# Patient Record
Sex: Male | Born: 1965 | Race: White | Hispanic: No | Marital: Married | Smoking: Former smoker
Health system: Southern US, Community
[De-identification: ages and names within clinical notes are randomized; demographics above are authoritative.]

## PROBLEM LIST (undated history)

## (undated) ENCOUNTER — Emergency Department: Discharge status: Absent without leave | Payer: Managed Care, Other (non HMO)

## (undated) DIAGNOSIS — I209 Angina pectoris, unspecified: Secondary | ICD-10-CM

## (undated) DIAGNOSIS — J189 Pneumonia, unspecified organism: Secondary | ICD-10-CM

## (undated) DIAGNOSIS — C4491 Basal cell carcinoma of skin, unspecified: Secondary | ICD-10-CM

## (undated) DIAGNOSIS — I10 Essential (primary) hypertension: Secondary | ICD-10-CM

## (undated) DIAGNOSIS — F419 Anxiety disorder, unspecified: Secondary | ICD-10-CM

## (undated) DIAGNOSIS — I639 Cerebral infarction, unspecified: Secondary | ICD-10-CM

## (undated) DIAGNOSIS — J45909 Unspecified asthma, uncomplicated: Secondary | ICD-10-CM

## (undated) DIAGNOSIS — M1990 Unspecified osteoarthritis, unspecified site: Secondary | ICD-10-CM

## (undated) DIAGNOSIS — G4730 Sleep apnea, unspecified: Secondary | ICD-10-CM

## (undated) DIAGNOSIS — Z8489 Family history of other specified conditions: Secondary | ICD-10-CM

## (undated) HISTORY — DX: Basal cell carcinoma of skin, unspecified: C44.91

## (undated) HISTORY — PX: HERNIA REPAIR: SHX51

## (undated) HISTORY — PX: FRACTURE SURGERY: SHX138

---

## 2004-06-02 ENCOUNTER — Emergency Department: Payer: Self-pay | Admitting: Unknown Physician Specialty

## 2004-06-16 ENCOUNTER — Emergency Department: Payer: Self-pay | Admitting: Emergency Medicine

## 2005-05-02 ENCOUNTER — Emergency Department: Payer: Self-pay | Admitting: Emergency Medicine

## 2005-05-02 ENCOUNTER — Other Ambulatory Visit: Payer: Self-pay

## 2005-11-20 ENCOUNTER — Emergency Department: Payer: Self-pay | Admitting: Emergency Medicine

## 2005-11-28 ENCOUNTER — Emergency Department: Payer: Self-pay | Admitting: Unknown Physician Specialty

## 2005-12-13 ENCOUNTER — Emergency Department (HOSPITAL_COMMUNITY): Admission: EM | Admit: 2005-12-13 | Discharge: 2005-12-13 | Payer: Self-pay | Admitting: Emergency Medicine

## 2006-03-21 ENCOUNTER — Ambulatory Visit: Payer: Self-pay | Admitting: Rheumatology

## 2007-11-17 ENCOUNTER — Emergency Department: Payer: Self-pay | Admitting: Emergency Medicine

## 2010-11-14 ENCOUNTER — Ambulatory Visit: Payer: Self-pay | Admitting: Specialist

## 2011-02-09 ENCOUNTER — Ambulatory Visit: Payer: Self-pay | Admitting: Surgery

## 2011-02-15 ENCOUNTER — Ambulatory Visit: Payer: Self-pay | Admitting: Surgery

## 2013-04-18 ENCOUNTER — Inpatient Hospital Stay: Payer: Self-pay | Admitting: Internal Medicine

## 2013-04-18 LAB — CBC
HCT: 47.1 % (ref 40.0–52.0)
HGB: 16.2 g/dL (ref 13.0–18.0)
MCH: 30.3 pg (ref 26.0–34.0)
MCHC: 34.3 g/dL (ref 32.0–36.0)
MCV: 88 fL (ref 80–100)
Platelet: 170 10*3/uL (ref 150–440)
RBC: 5.35 10*6/uL (ref 4.40–5.90)
RDW: 12.9 % (ref 11.5–14.5)
WBC: 5.1 10*3/uL (ref 3.8–10.6)

## 2013-04-18 LAB — COMPREHENSIVE METABOLIC PANEL
ALK PHOS: 90 U/L
ALT: 41 U/L (ref 12–78)
ANION GAP: 4 — AB (ref 7–16)
AST: 39 U/L — AB (ref 15–37)
Albumin: 3.7 g/dL (ref 3.4–5.0)
BILIRUBIN TOTAL: 0.4 mg/dL (ref 0.2–1.0)
BUN: 14 mg/dL (ref 7–18)
Calcium, Total: 8.3 mg/dL — ABNORMAL LOW (ref 8.5–10.1)
Chloride: 103 mmol/L (ref 98–107)
Co2: 29 mmol/L (ref 21–32)
Creatinine: 1.08 mg/dL (ref 0.60–1.30)
Glucose: 87 mg/dL (ref 65–99)
Osmolality: 272 (ref 275–301)
POTASSIUM: 4.1 mmol/L (ref 3.5–5.1)
Sodium: 136 mmol/L (ref 136–145)
Total Protein: 7.6 g/dL (ref 6.4–8.2)

## 2013-04-18 LAB — TROPONIN I: Troponin-I: <0.02 ng/mL

## 2013-04-18 LAB — CK TOTAL AND CKMB (NOT AT ARMC)
CK, Total: 225 U/L
CK-MB: <0.5 ng/mL — ABNORMAL LOW (ref 0.5–3.6)

## 2013-04-18 LAB — RAPID INFLUENZA A&B ANTIGENS

## 2013-04-19 LAB — CBC WITH DIFFERENTIAL/PLATELET
BASOS ABS: 0 10*3/uL (ref 0.0–0.1)
Basophil %: 0.5 %
EOS ABS: 0 10*3/uL (ref 0.0–0.7)
Eosinophil %: 0.1 %
HCT: 50.3 % (ref 40.0–52.0)
HGB: 16.6 g/dL (ref 13.0–18.0)
Lymphocyte #: 0.8 10*3/uL — ABNORMAL LOW (ref 1.0–3.6)
Lymphocyte %: 17.5 %
MCH: 29.1 pg (ref 26.0–34.0)
MCHC: 33 g/dL (ref 32.0–36.0)
MCV: 88 fL (ref 80–100)
MONO ABS: 0.2 x10 3/mm (ref 0.2–1.0)
Monocyte %: 3.8 %
Neutrophil #: 3.7 10*3/uL (ref 1.4–6.5)
Neutrophil %: 78.1 %
PLATELETS: 174 10*3/uL (ref 150–440)
RBC: 5.7 10*6/uL (ref 4.40–5.90)
RDW: 13.5 % (ref 11.5–14.5)
WBC: 4.7 10*3/uL (ref 3.8–10.6)

## 2013-04-19 LAB — BASIC METABOLIC PANEL
ANION GAP: 6 — AB (ref 7–16)
BUN: 15 mg/dL (ref 7–18)
CALCIUM: 8.3 mg/dL — AB (ref 8.5–10.1)
CREATININE: 0.92 mg/dL (ref 0.60–1.30)
Chloride: 103 mmol/L (ref 98–107)
Co2: 26 mmol/L (ref 21–32)
EGFR (Non-African Amer.): >60
Glucose: 139 mg/dL — ABNORMAL HIGH (ref 65–99)
Osmolality: 273 (ref 275–301)
POTASSIUM: 4.2 mmol/L (ref 3.5–5.1)
SODIUM: 135 mmol/L — AB (ref 136–145)

## 2013-04-20 LAB — CBC WITH DIFFERENTIAL/PLATELET
BASOS ABS: 0.1 10*3/uL (ref 0.0–0.1)
BASOS PCT: 0.4 %
EOS PCT: 0 %
Eosinophil #: 0 10*3/uL (ref 0.0–0.7)
HCT: 47.1 % (ref 40.0–52.0)
HGB: 16.1 g/dL (ref 13.0–18.0)
LYMPHS ABS: 1.3 10*3/uL (ref 1.0–3.6)
LYMPHS PCT: 8.2 %
MCH: 30.2 pg (ref 26.0–34.0)
MCHC: 34.1 g/dL (ref 32.0–36.0)
MCV: 88 fL (ref 80–100)
MONOS PCT: 4.7 %
Monocyte #: 0.7 x10 3/mm (ref 0.2–1.0)
Neutrophil #: 13.6 10*3/uL — ABNORMAL HIGH (ref 1.4–6.5)
Neutrophil %: 86.7 %
Platelet: 199 10*3/uL (ref 150–440)
RBC: 5.32 10*6/uL (ref 4.40–5.90)
RDW: 13.2 % (ref 11.5–14.5)
WBC: 15.7 10*3/uL — AB (ref 3.8–10.6)

## 2013-04-20 LAB — BASIC METABOLIC PANEL
ANION GAP: 8 (ref 7–16)
BUN: 22 mg/dL — AB (ref 7–18)
CHLORIDE: 99 mmol/L (ref 98–107)
CREATININE: 1 mg/dL (ref 0.60–1.30)
Calcium, Total: 8.3 mg/dL — ABNORMAL LOW (ref 8.5–10.1)
Co2: 28 mmol/L (ref 21–32)
EGFR (African American): >60
EGFR (Non-African Amer.): >60
GLUCOSE: 159 mg/dL — AB (ref 65–99)
Osmolality: 277 (ref 275–301)
Potassium: 3.9 mmol/L (ref 3.5–5.1)
Sodium: 135 mmol/L — ABNORMAL LOW (ref 136–145)

## 2013-04-23 LAB — CULTURE, BLOOD (SINGLE)

## 2014-01-10 ENCOUNTER — Emergency Department: Payer: Self-pay | Admitting: Emergency Medicine

## 2014-01-11 LAB — COMPREHENSIVE METABOLIC PANEL
AST: 30 U/L (ref 15–37)
Albumin: 4 g/dL (ref 3.4–5.0)
Alkaline Phosphatase: 113 U/L
Anion Gap: 9 (ref 7–16)
BUN: 17 mg/dL (ref 7–18)
Bilirubin,Total: 0.5 mg/dL (ref 0.2–1.0)
CALCIUM: 8.1 mg/dL — AB (ref 8.5–10.1)
CHLORIDE: 104 mmol/L (ref 98–107)
Co2: 26 mmol/L (ref 21–32)
Creatinine: 1 mg/dL (ref 0.60–1.30)
EGFR (African American): >60
GLUCOSE: 91 mg/dL (ref 65–99)
OSMOLALITY: 279 (ref 275–301)
Potassium: 3.7 mmol/L (ref 3.5–5.1)
SGPT (ALT): 42 U/L
Sodium: 139 mmol/L (ref 136–145)
Total Protein: 7.9 g/dL (ref 6.4–8.2)

## 2014-01-11 LAB — LIPASE, BLOOD: Lipase: 153 U/L (ref 73–393)

## 2014-01-11 LAB — CBC WITH DIFFERENTIAL/PLATELET
Basophil #: 0.1 10*3/uL (ref 0.0–0.1)
Basophil %: 0.6 %
EOS ABS: 0.2 10*3/uL (ref 0.0–0.7)
Eosinophil %: 1.3 %
HCT: 46.7 % (ref 40.0–52.0)
HGB: 15.9 g/dL (ref 13.0–18.0)
LYMPHS PCT: 31 %
Lymphocyte #: 3.5 10*3/uL (ref 1.0–3.6)
MCH: 29.6 pg (ref 26.0–34.0)
MCHC: 34.1 g/dL (ref 32.0–36.0)
MCV: 87 fL (ref 80–100)
MONO ABS: 0.9 x10 3/mm (ref 0.2–1.0)
Monocyte %: 7.8 %
NEUTROS ABS: 6.7 10*3/uL — AB (ref 1.4–6.5)
Neutrophil %: 59.3 %
Platelet: 294 10*3/uL (ref 150–440)
RBC: 5.38 10*6/uL (ref 4.40–5.90)
RDW: 13.5 % (ref 11.5–14.5)
WBC: 11.4 10*3/uL — AB (ref 3.8–10.6)

## 2014-01-11 LAB — TROPONIN I: Troponin-I: <0.02 ng/mL

## 2014-01-22 ENCOUNTER — Emergency Department (HOSPITAL_COMMUNITY): Payer: Managed Care, Other (non HMO)

## 2014-01-22 ENCOUNTER — Encounter (HOSPITAL_COMMUNITY): Payer: Self-pay

## 2014-01-22 ENCOUNTER — Inpatient Hospital Stay (HOSPITAL_COMMUNITY)
Admission: EM | Admit: 2014-01-22 | Discharge: 2014-01-25 | DRG: 069 | Disposition: A | Discharge status: Home / Self Care | Payer: Managed Care, Other (non HMO) | Attending: Neurology | Admitting: Neurology

## 2014-01-22 ENCOUNTER — Inpatient Hospital Stay (HOSPITAL_COMMUNITY): Payer: Managed Care, Other (non HMO)

## 2014-01-22 DIAGNOSIS — F329 Major depressive disorder, single episode, unspecified: Secondary | ICD-10-CM

## 2014-01-22 DIAGNOSIS — R51 Headache: Secondary | ICD-10-CM | POA: Diagnosis not present

## 2014-01-22 DIAGNOSIS — Z6830 Body mass index (BMI) 30.0-30.9, adult: Secondary | ICD-10-CM | POA: Diagnosis not present

## 2014-01-22 DIAGNOSIS — E785 Hyperlipidemia, unspecified: Secondary | ICD-10-CM | POA: Diagnosis present

## 2014-01-22 DIAGNOSIS — M069 Rheumatoid arthritis, unspecified: Secondary | ICD-10-CM | POA: Diagnosis present

## 2014-01-22 DIAGNOSIS — I10 Essential (primary) hypertension: Secondary | ICD-10-CM | POA: Diagnosis present

## 2014-01-22 DIAGNOSIS — I639 Cerebral infarction, unspecified: Secondary | ICD-10-CM

## 2014-01-22 DIAGNOSIS — E669 Obesity, unspecified: Secondary | ICD-10-CM | POA: Diagnosis present

## 2014-01-22 DIAGNOSIS — G459 Transient cerebral ischemic attack, unspecified: Secondary | ICD-10-CM | POA: Diagnosis not present

## 2014-01-22 DIAGNOSIS — M1990 Unspecified osteoarthritis, unspecified site: Secondary | ICD-10-CM | POA: Diagnosis present

## 2014-01-22 DIAGNOSIS — R112 Nausea with vomiting, unspecified: Secondary | ICD-10-CM | POA: Diagnosis not present

## 2014-01-22 DIAGNOSIS — R531 Weakness: Secondary | ICD-10-CM | POA: Diagnosis present

## 2014-01-22 DIAGNOSIS — F32A Depression, unspecified: Secondary | ICD-10-CM

## 2014-01-22 DIAGNOSIS — Z9109 Other allergy status, other than to drugs and biological substances: Secondary | ICD-10-CM

## 2014-01-22 HISTORY — DX: Unspecified osteoarthritis, unspecified site: M19.90

## 2014-01-22 HISTORY — DX: Essential (primary) hypertension: I10

## 2014-01-22 LAB — COMPREHENSIVE METABOLIC PANEL
ALBUMIN: 4.2 g/dL (ref 3.5–5.2)
ALT: 29 U/L (ref 0–53)
ANION GAP: 15 (ref 5–15)
AST: 22 U/L (ref 0–37)
Alkaline Phosphatase: 118 U/L — ABNORMAL HIGH (ref 39–117)
BILIRUBIN TOTAL: 0.5 mg/dL (ref 0.3–1.2)
BUN: 12 mg/dL (ref 6–23)
CALCIUM: 9.1 mg/dL (ref 8.4–10.5)
CHLORIDE: 96 meq/L (ref 96–112)
CO2: 25 mEq/L (ref 19–32)
CREATININE: 0.89 mg/dL (ref 0.50–1.35)
GFR calc Af Amer: >90 mL/min (ref >90)
GFR calc non Af Amer: >90 mL/min (ref >90)
Glucose, Bld: 122 mg/dL — ABNORMAL HIGH (ref 70–99)
Potassium: 4.4 mEq/L (ref 3.7–5.3)
Sodium: 136 mEq/L — ABNORMAL LOW (ref 137–147)
TOTAL PROTEIN: 7.5 g/dL (ref 6.0–8.3)

## 2014-01-22 LAB — RAPID URINE DRUG SCREEN, HOSP PERFORMED
AMPHETAMINES: NOT DETECTED
BENZODIAZEPINES: NOT DETECTED
Barbiturates: NOT DETECTED
COCAINE: NOT DETECTED
OPIATES: NOT DETECTED
TETRAHYDROCANNABINOL: NOT DETECTED

## 2014-01-22 LAB — I-STAT CHEM 8, ED
BUN: 12 mg/dL (ref 6–23)
CHLORIDE: 99 meq/L (ref 96–112)
Calcium, Ion: 1.09 mmol/L — ABNORMAL LOW (ref 1.12–1.23)
Creatinine, Ser: 0.9 mg/dL (ref 0.50–1.35)
Glucose, Bld: 127 mg/dL — ABNORMAL HIGH (ref 70–99)
HEMATOCRIT: 51 % (ref 39.0–52.0)
Hemoglobin: 17.3 g/dL — ABNORMAL HIGH (ref 13.0–17.0)
Potassium: 4.2 mEq/L (ref 3.7–5.3)
SODIUM: 136 meq/L — AB (ref 137–147)
TCO2: 24 mmol/L (ref 0–100)

## 2014-01-22 LAB — DIFFERENTIAL
BASOS ABS: 0.1 10*3/uL (ref 0.0–0.1)
BASOS PCT: 1 % (ref 0–1)
EOS ABS: 0.1 10*3/uL (ref 0.0–0.7)
EOS PCT: 1 % (ref 0–5)
Lymphocytes Relative: 23 % (ref 12–46)
Lymphs Abs: 2.3 10*3/uL (ref 0.7–4.0)
MONO ABS: 0.8 10*3/uL (ref 0.1–1.0)
MONOS PCT: 9 % (ref 3–12)
NEUTROS ABS: 6.6 10*3/uL (ref 1.7–7.7)
Neutrophils Relative %: 66 % (ref 43–77)

## 2014-01-22 LAB — PROTIME-INR
INR: 0.92 (ref 0.00–1.49)
Prothrombin Time: 12.5 seconds (ref 11.6–15.2)

## 2014-01-22 LAB — I-STAT TROPONIN, ED: TROPONIN I, POC: 0 ng/mL (ref 0.00–0.08)

## 2014-01-22 LAB — URINALYSIS, ROUTINE W REFLEX MICROSCOPIC
BILIRUBIN URINE: NEGATIVE
GLUCOSE, UA: NEGATIVE mg/dL
HGB URINE DIPSTICK: NEGATIVE
Ketones, ur: NEGATIVE mg/dL
Leukocytes, UA: NEGATIVE
Nitrite: NEGATIVE
PROTEIN: NEGATIVE mg/dL
Specific Gravity, Urine: 1.009 (ref 1.005–1.030)
UROBILINOGEN UA: 0.2 mg/dL (ref 0.0–1.0)
pH: 6.5 (ref 5.0–8.0)

## 2014-01-22 LAB — CBC
HEMATOCRIT: 46.9 % (ref 39.0–52.0)
HEMOGLOBIN: 16.7 g/dL (ref 13.0–17.0)
MCH: 30.5 pg (ref 26.0–34.0)
MCHC: 35.6 g/dL (ref 30.0–36.0)
MCV: 85.7 fL (ref 78.0–100.0)
Platelets: 324 10*3/uL (ref 150–400)
RBC: 5.47 MIL/uL (ref 4.22–5.81)
RDW: 12.5 % (ref 11.5–15.5)
WBC: 9.8 10*3/uL (ref 4.0–10.5)

## 2014-01-22 LAB — APTT: APTT: 30 s (ref 24–37)

## 2014-01-22 LAB — ETHANOL

## 2014-01-22 LAB — CBG MONITORING, ED: GLUCOSE-CAPILLARY: 121 mg/dL — AB (ref 70–99)

## 2014-01-22 MED ORDER — PANTOPRAZOLE SODIUM 40 MG IV SOLR
40.0000 mg | Freq: Every day | INTRAVENOUS | Status: DC
  Administered 2014-01-22: 40 mg via INTRAVENOUS
  Filled 2014-01-22 (×2): qty 40

## 2014-01-22 MED ORDER — ACETAMINOPHEN 325 MG PO TABS
650.0000 mg | ORAL_TABLET | ORAL | Status: DC | PRN
  Administered 2014-01-22 – 2014-01-24 (×4): 650 mg via ORAL
  Filled 2014-01-22 (×4): qty 2

## 2014-01-22 MED ORDER — STUDY - INVESTIGATIONAL DRUG SIMPLE RECORD
0.9000 mg/kg | Freq: Once | Status: AC
  Administered 2014-01-22: 83.7 mg via INTRAVENOUS
  Filled 2014-01-22: qty 83.7

## 2014-01-22 MED ORDER — STUDY - INVESTIGATIONAL DRUG SIMPLE RECORD
325.0000 mg | Freq: Once | Status: AC
  Administered 2014-01-22: 325 mg via ORAL
  Filled 2014-01-22: qty 325

## 2014-01-22 MED ORDER — TRAMADOL HCL 50 MG PO TABS
50.0000 mg | ORAL_TABLET | Freq: Four times a day (QID) | ORAL | Status: DC | PRN
  Administered 2014-01-22 – 2014-01-25 (×8): 50 mg via ORAL
  Filled 2014-01-22 (×4): qty 1

## 2014-01-22 MED ORDER — ACETAMINOPHEN 650 MG RE SUPP: 650.0000 mg | RECTAL | Status: DC | PRN

## 2014-01-22 MED ORDER — STROKE: EARLY STAGES OF RECOVERY BOOK
Freq: Once | Status: AC
  Administered 2014-01-22: 18:00:00
  Filled 2014-01-22: qty 1

## 2014-01-22 MED ORDER — SODIUM CHLORIDE 0.9 % IV SOLN
INTRAVENOUS | Status: DC
  Administered 2014-01-22 – 2014-01-23 (×2): via INTRAVENOUS

## 2014-01-22 MED ORDER — LABETALOL HCL 5 MG/ML IV SOLN
10.0000 mg | INTRAVENOUS | Status: DC | PRN
Start: 2014-01-22 — End: 2014-01-25
  Administered 2014-01-24: 10 mg via INTRAVENOUS
  Filled 2014-01-22 (×2): qty 4

## 2014-01-22 NOTE — ED Provider Notes (Signed)
48 y.o. Male presents as code stroke with report of left sided numbness and weakness that began one hour 15 minutes prior to arrival.  Patient assessed at bridge with stoke team Dr. Amada Jupiter.  Patient awake and alert with clear airway.  No obvious facial droop.  Dr.Kirkpatrick reports some residual left upper extremity weakness.  Patient states improving.   PMH hypertension meds unknown PE wdwn male nad No facial droop  Tone noted to all extremities  Patient to ct    Hilario Quarry, MD 01/24/14 [phone]

## 2014-01-22 NOTE — ED Notes (Signed)
Per EMS, patient was with a coworker and suddenly at 1330 patient had left sided weakness. EMS reports slight left sided droop, left arm weakness and left leg weakness. Reports symptoms are starting to improve.

## 2014-01-22 NOTE — Progress Notes (Addendum)
Patient arrived from ED accompanied with wfe. Safety precautions and orders reviewed with patient. He c/o minimal pain on his side of hid head and left extremeties. Patient and family verbalized that this is a new onset of pain. TELE applied and confirmed. MD paged. Dr. Hosie Poisson aware.   Sim Boast, RN

## 2014-01-22 NOTE — H&P (Signed)
Neurology H&P Reason for Consult: Left-sided weakness  CC: Left-sided weakness  History is obtained from: Patient  HPI: Fernando Goodwin is a 48 y.o. male who was in his normal state of health this morning, however around 1:30 PM he noticed a significant left-sided weakness. He states that it came on suddenly and EMS was called. Shortly before arrival in the hospital, he rapidly begin to improve, but did not return completely to baseline. At initial examination, he still had some mild sensory change on his left face as well as left leg and left hand weakness that were not felt to be disabling. After discussion with both him and his wife with Dr. Pearlean Brownie they decided to go ahead with PRISMS trial.  Of note, he has rheumatoid arthritis and has been on Enbrel in the past.   ROS: A 14 point ROS was performed and is negative except as noted in the HPI.   Past Medical History  Diagnosis Date  . Hypertension   . Arthritis     Family History: No history of stroke  Social History: Tob: Denies  Exam: Current vital signs: BP 145/73 mmHg  Pulse 87  Temp(Src) 97.5 F (36.4 C) (Oral)  Resp 20  Wt 92.987 kg (205 lb)  SpO2 94% Vital signs in last 24 hours: Temp:  [97.5 F (36.4 C)] 97.5 F (36.4 C) (12/03 1603) Pulse Rate:  [84-97] 87 (12/03 1645) Resp:  [16-23] 20 (12/03 1645) BP: (137-153)/(73-89) 145/73 mmHg (12/03 1645) SpO2:  [87 %-96 %] 94 % (12/03 1645) Weight:  [92.987 kg (205 lb)] 92.987 kg (205 lb) (12/03 1543)  General: In bed, NAD  Physical Exam  Constitutional: Appears well-developed and well-nourished.  Psych: Affect appropriate to situation Eyes: No scleral injection HENT: No OP obstrucion Head: Normocephalic.  Cardiovascular: Normal rate and regular rhythm.  Respiratory: Effort normal and breath sounds normal to anterior ascultation GI: Soft.  No distension. There is no tenderness.  Skin: WDI  Neuro: Mental Status: Patient is awake, alert, oriented to person,  place, month, year, and situation. Immediate and remote memory are intact. Patient is able to give a clear and coherent history. No signs of aphasia or neglect Cranial Nerves: II: Visual Fields are full. Pupils are equal, round, and reactive to light.  Discs are difficult to visualize. III,IV, VI: EOMI without ptosis or diploplia.  V: Facial sensation is symmetric to temperature VII: Facial movement is symmetric.  VIII: hearing is intact to voice X: Uvula elevates symmetrically XI: Shoulder shrug is symmetric. XII: tongue is midline without atrophy or fasciculations.  Motor: Tone is normal. Bulk is normal. 5/5 strength was present on the right. He has full strength proximally in the left arm with mild weakness and slowed movements of the left hand. He has 4/5 weakness of the left hip flexors.  Sensory: Sensation is symmetric to light touch and temperature in the arms and legs.  Deep Tendon Reflexes: 2+ and symmetric in the biceps and patellae.  Cerebellar: FNF and HKS are intact bilaterally Gait: Patient is able to ambulate without assistance.     I have reviewed labs in epic and the results pertinent to this consultation are: Cbc, cmp - unremarkable.   I have reviewed the images obtained:CT head - negative.   Impression: 48 yo M with left sided weakness most consistent with acute stroke. With improvement, he was not a candidate for on label tPA , but after discussion with him and his wife, they decided to pursue the PRISMS  trial(tpa vs. Placebo for mild stroke)  Recommendations: 1. HgbA1c, fasting lipid panel 2. MRI, MRA  of the brain without contrast 3. Frequent neuro checks 4. Echocardiogram 5. Carotid dopplers 6. Prophylactic therapy-Antiplatelet med: as per PRISMS trial.  7. Risk factor modification 8. Telemetry monitoring 9. PT consult, OT consult, Speech consult 10. contnue home paxil     This patient is critically ill and at significant risk of neurological  worsening, death and care requires constant monitoring of vital signs, hemodynamics,respiratory and cardiac monitoring, neurological assessment, discussion with family, other specialists and medical decision making of high complexity. I spent 60 minutes of neurocritical care time  in the care of  this patient.  Ritta Slot, MD [facility] [phone]  If 7pm- 7am, please page neurology on call as listed in AMION. 01/22/2014  5:32 PM

## 2014-01-22 NOTE — ED Provider Notes (Signed)
CSN: [id number]     Arrival date & time 01/22/14  1508 History   First MD Initiated Contact with Patient 01/22/14 1513     Chief Complaint  Patient presents with  . Code Stroke     (Consider location/radiation/quality/duration/timing/severity/associated sxs/prior Treatment) HPI Comments: Patient is a 48 year old male with history of hypertension. He presents with complaints of weakness to his left arm and leg and numbness to his left face that started at approximately 1:30 this afternoon while at work. He states he felt as if he "could not think". His symptoms began to resolve somewhat in route.  Patient is a 48 y.o. male presenting with weakness. The history is provided by the patient.  Weakness This is a new problem. The current episode started 1 to 2 hours ago. The problem occurs constantly. The problem has been gradually improving. Pertinent negatives include no chest pain, no headaches and no shortness of breath. Nothing aggravates the symptoms. Nothing relieves the symptoms. He has tried nothing for the symptoms. The treatment provided no relief.    Past Medical History  Diagnosis Date  . Hypertension   . Arthritis    Past Surgical History  Procedure Laterality Date  . Hernia repair     No family history on file. History  Substance Use Topics  . Smoking status: Not on file  . Smokeless tobacco: Not on file  . Alcohol Use: Not on file    Review of Systems  Respiratory: Negative for shortness of breath.   Cardiovascular: Negative for chest pain.  Neurological: Positive for weakness. Negative for headaches.  All other systems reviewed and are negative.     Allergies  Review of patient's allergies indicates no known allergies.  Home Medications   Prior to Admission medications   Not on File   BP 140/89 mmHg  Pulse 84  Temp(Src) 97.5 F (36.4 C) (Oral)  Resp 16  SpO2 96% Physical Exam  Constitutional: He is oriented to person, place, and time. He appears  well-developed and well-nourished. No distress.  HENT:  Head: Normocephalic and atraumatic.  Neck: Normal range of motion. Neck supple.  Cardiovascular: Normal rate, regular rhythm and normal heart sounds.   No murmur heard. Pulmonary/Chest: Effort normal and breath sounds normal. No respiratory distress. He has no wheezes.  Abdominal: Soft. Bowel sounds are normal. He exhibits no distension. There is no tenderness.  Musculoskeletal: Normal range of motion. He exhibits no edema.  Lymphadenopathy:    He has no cervical adenopathy.  Neurological: He is alert and oriented to person, place, and time. No cranial nerve deficit. He exhibits abnormal muscle tone. Coordination normal.  Strength is 4+ out of 5 in the left upper and left lower extremity. It is high out of 5 in the right upper and right lower extremity.  Skin: Skin is warm and dry. He is not diaphoretic.  Nursing note and vitals reviewed.   ED Course  Procedures (including critical care time) Labs Review Labs Reviewed  I-STAT CHEM 8, ED - Abnormal; Notable for the following:    Sodium 136 (*)    Glucose, Bld 127 (*)    Calcium, Ion 1.09 (*)    Hemoglobin 17.3 (*)    All other components within normal limits  PROTIME-INR  APTT  CBC  DIFFERENTIAL  ETHANOL  COMPREHENSIVE METABOLIC PANEL  URINE RAPID DRUG SCREEN (HOSP PERFORMED)  URINALYSIS, ROUTINE W REFLEX MICROSCOPIC  I-STAT TROPOININ, ED  I-STAT TROPOININ, ED    Imaging Review Ct Head Wo  Contrast  01/22/2014   CLINICAL DATA:  Left-sided weakness.  EXAM: CT HEAD WITHOUT CONTRAST  TECHNIQUE: Contiguous axial images were obtained from the base of the skull through the vertex without intravenous contrast.  COMPARISON:  CT scan of January 11, 2014.  FINDINGS: Bony calvarium appears intact. No mass effect or midline shift is noted. Ventricular size is within normal limits. There is no evidence of mass lesion, hemorrhage or acute infarction.  IMPRESSION: Normal head CT.  Critical Value/emergent results were called by telephone at the time of interpretation on 01/22/2014 at 3:39 pm to Dr. Margarita Grizzle , who verbally acknowledged these results.   Electronically Signed   By: Roque Lias M.D.   On: 01/22/2014 15:40     EKG Interpretation   Date/Time:  Thursday January 22 2014 15:31:50 EST Ventricular Rate:  85 PR Interval:  130 QRS Duration: 91 QT Interval:  377 QTC Calculation: 448 R Axis:   27 Text Interpretation:  Sinus rhythm Ventricular premature complex Consider  right atrial enlargement Baseline wander in lead(s) V3 Confirmed by DELOS   MD, Gwendloyn Forsee ([id number]) on 01/22/2014 3:49:36 PM      MDM   Final diagnoses:  None    Patient arrives to the ER as a code stroke with left arm and left leg numbness and weakness that started approximately 90 minutes prior to arrival. He was immediately taken to CAT scan and evaluated by myself and neurology. Dr. Amada Jupiter will admit the patient for further evaluation and intervention as deemed necessary.    Fernando Lyons, MD 01/22/14 [phone]

## 2014-01-22 NOTE — Code Documentation (Signed)
48yo male arriving to [facility] via [facility] at 1508.  Patient was at [employer] when he had sudden onset left sided weakness.  Code stroke called by EMS.  Patient taken to CT.  Stroke team at the bedside.  Initial NIHSS 2, see documentation for details and code stroke times.  Patient with improving symptoms.  Dr. Amada Jupiter discussed research enrollment with patient.  Dr. Pearlean Brownie and research team to the bedside to discuss PRISMS trial.  Patient agreed to PRISMS trial.  Patient passed Stroke Swallow Screen.  PRISMS study drug given at 1617.  Patient to be admitted to [facility].  Bedside handoff with ED RN Liz Beach.

## 2014-01-22 NOTE — Progress Notes (Signed)
Patient is having increased pain and tingling sensation in his head and radiating to left periorbital region. Patient requested for pain meds  Dr. Thad Ranger notified about the symtom. Will put in order for pain med and Rn will continue to monitor

## 2014-01-23 ENCOUNTER — Encounter (HOSPITAL_COMMUNITY): Payer: Self-pay | Admitting: *Deleted

## 2014-01-23 ENCOUNTER — Inpatient Hospital Stay (HOSPITAL_COMMUNITY): Payer: Managed Care, Other (non HMO)

## 2014-01-23 LAB — LIPID PANEL
CHOLESTEROL: 170 mg/dL (ref 0–200)
HDL: 39 mg/dL — ABNORMAL LOW (ref >39)
LDL Cholesterol: 78 mg/dL (ref 0–99)
TRIGLYCERIDES: 267 mg/dL — AB (ref <150)
Total CHOL/HDL Ratio: 4.4 RATIO
VLDL: 53 mg/dL — AB (ref 0–40)

## 2014-01-23 LAB — HEMOGLOBIN A1C
HEMOGLOBIN A1C: 5.5 % (ref <5.7)
MEAN PLASMA GLUCOSE: 111 mg/dL (ref <117)

## 2014-01-23 MED ORDER — ATORVASTATIN CALCIUM 40 MG PO TABS
ORAL_TABLET | ORAL | Status: AC
  Filled 2014-01-23: qty 1

## 2014-01-23 MED ORDER — PANTOPRAZOLE SODIUM 40 MG PO TBEC
40.0000 mg | DELAYED_RELEASE_TABLET | Freq: Every day | ORAL | Status: DC
  Administered 2014-01-23: 40 mg via ORAL

## 2014-01-23 MED ORDER — ATORVASTATIN CALCIUM 10 MG PO TABS
20.0000 mg | ORAL_TABLET | Freq: Every day | ORAL | Status: DC
  Administered 2014-01-23 – 2014-01-24 (×2): 20 mg via ORAL
  Filled 2014-01-23: qty 2

## 2014-01-23 MED ORDER — ALUM & MAG HYDROXIDE-SIMETH 200-200-20 MG/5ML PO SUSP: 15.0000 mL | ORAL | Status: DC | PRN

## 2014-01-23 MED ORDER — TRAMADOL HCL 50 MG PO TABS
ORAL_TABLET | ORAL | Status: AC
  Filled 2014-01-23: qty 1

## 2014-01-23 NOTE — Progress Notes (Signed)
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 He states that it came on suddenly and EMS was called. Shortly before arrival in the hospital, he rapidly begin to improve, but did not return completely to baseline. At initial examination, he still had some mild sensory change on his left face as well as left leg and left hand weakness that were not felt to be nondisabling.( NIHSS 1) After discussion with both him and his wife with Dr. Pearlean Brownie they decided to go ahead with PRISMS trial. Of note, he has rheumatoid arthritis and has been on Enbrel in the past. Patient was not considered for on label TPA secondary to symptomatic improvement. He was admitted for further evaluation and treatment.   SUBJECTIVE (INTERVAL HISTORY) Wife at bedside. Pt feeling better. Still on bedrest. Discharge today or tomorrow pending completion of evaluation.   OBJECTIVE Temp:  [97 F (36.1 C)-98.4 F (36.9 C)] 97.6 F (36.4 C) (12/04 0900) Pulse Rate:  [69-97] 76 (12/04 0900) Cardiac Rhythm:  [-] Normal sinus rhythm (12/04 0800) Resp:  [15-23] 18 (12/04 0900) BP: (111-154)/(62-94) 122/75 mmHg (12/04 0900) SpO2:  [87 %-99 %] 99 % (12/04 0900) Weight:  [91.173 kg (201 lb)-92.987 kg (205 lb)] 91.173 kg (201 lb) (12/03 1800)   Recent Labs Lab 01/22/14 1611  GLUCAP 121*    Recent Labs Lab 01/22/14 1512 01/22/14 1516  NA 136* 136*  K 4.4 4.2  CL 96 99  CO2 25  --   GLUCOSE 122* 127*  BUN 12 12  CREATININE 0.89 0.90  CALCIUM 9.1  --     Recent Labs Lab 01/22/14 1512  AST 22  ALT 29  ALKPHOS 118*  BILITOT 0.5  PROT 7.5  ALBUMIN 4.2    Recent Labs Lab 01/22/14 1512 01/22/14 1516  WBC 9.8  --   NEUTROABS 6.6  --   HGB 16.7 17.3*  HCT 46.9 51.0  MCV 85.7  --   PLT 324  --    No results for input(s): CKTOTAL, CKMB, CKMBINDEX, TROPONINI  in the last 168 hours.  Recent Labs  01/22/14 1512  LABPROT 12.5  INR 0.92    Recent Labs  01/22/14 1545  COLORURINE YELLOW  LABSPEC 1.009  PHURINE 6.5  GLUCOSEU NEGATIVE  HGBUR NEGATIVE  BILIRUBINUR NEGATIVE  KETONESUR NEGATIVE  PROTEINUR NEGATIVE  UROBILINOGEN 0.2  NITRITE NEGATIVE  LEUKOCYTESUR NEGATIVE       Component Value Date/Time   CHOL 170 01/23/2014 0540   TRIG 267* 01/23/2014 0540   HDL 39* 01/23/2014 0540   CHOLHDL 4.4 01/23/2014 0540   VLDL 53* 01/23/2014 0540   LDLCALC 78 01/23/2014 0540   No results found for: HGBA1C    Component Value Date/Time   LABOPIA NONE DETECTED 01/22/2014 1543   COCAINSCRNUR NONE DETECTED 01/22/2014 1543   LABBENZ NONE DETECTED 01/22/2014 1543   AMPHETMU NONE DETECTED 01/22/2014 1543   THCU NONE DETECTED 01/22/2014 1543   LABBARB NONE DETECTED 01/22/2014 1543     Recent Labs Lab 01/22/14 1512  ETH <11    Ct Head Wo Contrast 01/22/2014    Stable and normal noncontrast CT appearance of the brain.   Ct Head Wo Contrast 01/22/2014  Normal head CT.    MRI / MRA - pending      PHYSICAL EXAM Pleasant middle aged Caucasian male not in  distress,Awake alert. Afebrile. Head is nontraumatic. Neck is supple without bruit. Hearing is normal. Cardiac exam no murmur or gallop. Lungs are clear to auscultation. Distal pulses are well felt.  Neurological Exam ;  Awake  Alert oriented x 3. Normal speech and language.eye movements full without nystagmus.fundi were not visualized. Vision acuity and fields appear normal. Hearing is normal. Palatal movements are normal. Face symmetric. Tongue midline. Normal strength, tone, reflexes and coordination mild diminished fine finger movements on the left. Mild weakness of left grip. Orbits right over left upper extremity. Normal sensation. Gait deferred.   ASSESSMENT/PLAN Mr. ABSHIR PAOLINI is a 48 y.o. male with history of RA and hypertension presenting with left sided weakness. He  did not receive IV t-PA per standard protocol due to symptom improvement; he was then enrolled in the PRISMS trial (tPA vs no tPA in pts too mild to treat).   Stroke:  Non-dominant right brain subcortical infarct/ TIA s/p PRISMS enrollment, likely secondary to  small vessel disease    Resultant  Left hemiparesis  MRI  pending  MRA  pending  Carotid Doppler  pending  2D Echo  pending  HgbA1c pending  SCDs for VTE prophylaxis  Diet Heart thin liquids  aspirin 81 mg orally every day prior to admission, received aspirin vs placebo yesterday for antithrombotic (Prisms protocol, based on tPA vs no tPA, blinded) now on no antithrombotics. Add  Aspirin after MRi  Patient counseled to be compliant with his antithrombotic medications  Ongoing aggressive stroke risk factor management  Therapy recommendations:  pending  Disposition:  Probable discharge to home Saturday.  Hypertension  Home meds:   Lisinopril, hyzaar  Stable  Hyperlipidemia  Home meds:  No statin  LDL 78, goal < 70  Lipitor added  Other Stroke Risk Factors  Obesity, Body mass index is 30.57 kg/(m^2).   Other Active Problems  RA  Hospital day # 1  Delton See PA-C [facility] Pager [phone] 01/23/2014, 3:09 PM I have personally examined this patient, reviewed notes, independently viewed imaging studies, participated in medical decision making and plan of care. I have made any additions or clarifications directly to the above note. Agree with note above. Suspect small subcortical right brain infarct due to small vessel disease versus TIA. Complete neurovascular workup. Likely discharge home in the morning after work up completed.  Delia Heady, MD Medical Director [facility] Pager: [phone] 01/23/2014 4:04 PM     To contact Stroke Continuity provider, please refer to [email or web address]. After hours, contact General Neurology

## 2014-01-23 NOTE — Evaluation (Signed)
Physical Therapy Evaluation/Discharge Patient Details Name: Fernando Goodwin MRN: [id number] DOB: 01-18-1966 Today's Date: 01/23/2014   History of Present Illness  48 yo male admitted with sudden L side weakness, L facial droop, LLE weakness. NIH= 2 stroke workup under way.  CT scan negative and MRI pending. PMH: HTN, arthritis, and hernia repair .  Clinical Impression  Pt is mobilizing at mod I and I levels, just slowly and cautiously. He has some very minimal strength deficits in left leg (4/5 per in bed and EOB MMT).  He is able to ascend and descend stairs and walk/get out of bed without physical assistance.  He does not currently have any acute or follow up PT needs at this time.  I encouraged him to continue to mobilize as his HA allowed.  PT will sign off.     Follow Up Recommendations No PT follow up    Equipment Recommendations  None recommended by PT    Recommendations for Other Services   NA    Precautions / Restrictions Precautions Precautions: None      Mobility  Bed Mobility Overal bed mobility: Independent                Transfers Overall transfer level: Independent Equipment used: None                Ambulation/Gait Ambulation/Gait assistance: Modified independent (Device/Increase time) Ambulation Distance (Feet): 100 Feet Assistive device: None Gait Pattern/deviations: Step-through pattern Gait velocity: decreased Gait velocity interpretation: Below normal speed for age/gender General Gait Details: Pt with slow, cautious gait pattern, but no obvious gait deviations or signs of buckling or dragging of his left foot.   Stairs Stairs: Yes Stairs assistance: Modified independent (Device/Increase time) Stair Management: One rail Right;Step to pattern;Forwards Number of Stairs: 5 General stair comments: Pt taking his time, but again, no signs of buckling on stairs, just slow, cautious gait pattern.       Modified Rankin (Stroke Patients  Only) Modified Rankin (Stroke Patients Only) Pre-Morbid Rankin Score: No symptoms Modified Rankin: Slight disability     Balance Overall balance assessment: No apparent balance deficits (not formally assessed)                                           Pertinent Vitals/Pain Pain Assessment: 0-10 Pain Score: 5  Pain Location: headache Pain Descriptors / Indicators: Aching Pain Intervention(s): Limited activity within patient's tolerance;Monitored during session;Premedicated before session;Repositioned    Home Living Family/patient expects to be discharged to:: Private residence Living Arrangements: Alone Available Help at Discharge: Family;Friend(s);Available PRN/intermittently Type of Home: House Home Access: Stairs to enter Entrance Stairs-Rails: None Entrance Stairs-Number of Steps: 4 Home Layout: One level Home Equipment: None      Prior Function Level of Independence: Independent         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 Coordination and sensation testing WNL.   Cervical / Trunk Assessment: Normal  Communication   Communication: No difficulties  Cognition Arousal/Alertness: Awake/alert Behavior During Therapy: WFL for tasks  assessed/performed Overall Cognitive Status: Within Functional Limits for tasks assessed                               Assessment/Plan    PT Assessment Patent does not need any further PT services  PT Diagnosis Difficulty walking;Abnormality of gait;Generalized weakness;Hemiplegia non-dominant side;Acute pain   PT Problem List    PT Treatment Interventions     PT Goals (Current goals can be found in the  Care Plan section) Acute Rehab PT Goals PT Goal Formulation: All assessment and education complete, DC therapy               End of Session Equipment Utilized During Treatment: Gait belt Activity Tolerance: Patient tolerated treatment well Patient left: in bed;with family/visitor present (seated EOB with daughter) Nurse Communication: Mobility status         Time: 7510-2585 PT Time Calculation (min) (ACUTE ONLY): 26 min   Charges:   PT Evaluation $Initial PT Evaluation Tier I: 1 Procedure PT Treatments $Gait Training: 8-22 mins        Talbot Monarch B. Macaila Tahir, PT, DPT [phone]   01/23/2014, 4:20 PM

## 2014-01-23 NOTE — Progress Notes (Addendum)
Patient c/o feeling worsen this morning after eating breakfast. He feel as if he is going to be nauseous but unsure. VSS. Patient is now c/o tingling on his left eyes radiating down to his left arm. Wife at bedside. Stroke team paged. Mrs. Catha Gosselin aware. Will continue to monitor.   Sim Boast, RN

## 2014-01-23 NOTE — Plan of Care (Signed)
Problem: Acute Treatment Outcomes Goal: Hemodynamically stable Outcome: Completed/Met Date Met:  01/23/14

## 2014-01-23 NOTE — Plan of Care (Signed)
Problem: Acute Treatment Outcomes Goal: Neuro exam at baseline or improved Outcome: Completed/Met Date Met:  01/23/14     

## 2014-01-23 NOTE — Plan of Care (Signed)
Problem: Acute Treatment Outcomes Goal: Airway maintained/protected Outcome: Completed/Met Date Met:  01/23/14

## 2014-01-23 NOTE — Evaluation (Signed)
Speech Language Pathology Evaluation Patient Details Name: Fernando Goodwin MRN: [id number] DOB: 1965-08-25 Today's Date: 01/23/2014 Time: 6503-5465 SLP Time Calculation (min) (ACUTE ONLY): 33 min  Problem List:  Patient Active Problem List   Diagnosis Date Noted  . Stroke 01/22/2014   Past Medical History:  Past Medical History  Diagnosis Date  . Hypertension   . Arthritis    Past Surgical History:  Past Surgical History  Procedure Laterality Date  . Hernia repair     HPI:  48 yo male admitted with sudden L side weakness, L facial droop, LLE weakness. NIH= 2 stroke workup under way.  CT scan negative and MRI pending. PMH: HTN, arthritis, and hernia repair .    Assessment / Plan / Recommendation Clinical Impression  Pt performed [facility] for cognitive-linguistic tasks, and speech is intelligible at the conversational level. Pt reports feeling "slower" to process and respond, however outside of structured tasks participates in functional and spontaneous conversations fluidly. No acute SLP f/u recommended at this time.    SLP Assessment  Patient does not need any further Speech Lanaguage Pathology Services    Follow Up Recommendations  None    Frequency and Duration        Pertinent Vitals/Pain Pain Assessment: 0-10 Pain Score: 5  Pain Location: headache Pain Descriptors / Indicators: Aching Pain Intervention(s): Monitored during session;Patient requesting pain meds-RN notified   SLP Goals  Patient/Family Stated Goal: none stated  SLP Evaluation Prior Functioning  Cognitive/Linguistic Baseline: Within functional limits Type of Home: House  Lives With: Spouse Available Help at Discharge: Family;Friend(s);Available PRN/intermittently Vocation: Full time employment   Cognition  Overall Cognitive Status: Within Functional Limits for tasks assessed Orientation Level: Oriented X4    Comprehension  Auditory Comprehension Overall Auditory Comprehension: Appears within  functional limits for tasks assessed Visual Recognition/Discrimination Discrimination: Within Function Limits Reading Comprehension Reading Status: Within funtional limits    Expression Expression Primary Mode of Expression: Verbal Verbal Expression Overall Verbal Expression: Appears within functional limits for tasks assessed Written Expression Dominant Hand: Right Written Expression: Not tested   Oral / Motor Oral Motor/Sensory Function Overall Oral Motor/Sensory Function: Appears within functional limits for tasks assessed Motor Speech Overall Motor Speech: Appears within functional limits for tasks assessed   GO       Maxcine Ham, M.A. CCC-SLP [phone]  Maxcine Ham 01/23/2014, 4:51 PM

## 2014-01-23 NOTE — Plan of Care (Signed)
Problem: Acute Treatment Outcomes Goal: BP within ordered parameters Outcome: Completed/Met Date Met:  01/23/14

## 2014-01-23 NOTE — Plan of Care (Signed)
Problem: Acute Treatment Outcomes Goal: 02 Sats > 94% Outcome: Completed/Met Date Met:  01/23/14     

## 2014-01-23 NOTE — Progress Notes (Signed)
  Echocardiogram 2D Echocardiogram has been performed.  Fernando Goodwin 01/23/2014, 10:44 AM

## 2014-01-23 NOTE — Plan of Care (Signed)
Problem: Acute Treatment Outcomes Goal: tPA Patient w/o S&S of bleeding Outcome: Completed/Met Date Met:  01/23/14

## 2014-01-23 NOTE — Plan of Care (Signed)
Problem: tPA Day Progression Outcomes-Only if tPA administered Goal: Post tPA VS, neuro checks Outcome: Progressing

## 2014-01-24 DIAGNOSIS — E785 Hyperlipidemia, unspecified: Secondary | ICD-10-CM

## 2014-01-24 DIAGNOSIS — I10 Essential (primary) hypertension: Secondary | ICD-10-CM

## 2014-01-24 DIAGNOSIS — G451 Carotid artery syndrome (hemispheric): Secondary | ICD-10-CM

## 2014-01-24 LAB — BASIC METABOLIC PANEL
Anion gap: 14 (ref 5–15)
BUN: 12 mg/dL (ref 6–23)
CO2: 25 mEq/L (ref 19–32)
Calcium: 9 mg/dL (ref 8.4–10.5)
Chloride: 97 mEq/L (ref 96–112)
Creatinine, Ser: 0.92 mg/dL (ref 0.50–1.35)
GLUCOSE: 113 mg/dL — AB (ref 70–99)
Potassium: 4.2 mEq/L (ref 3.7–5.3)
Sodium: 136 mEq/L — ABNORMAL LOW (ref 137–147)

## 2014-01-24 LAB — CBC
HCT: 45.2 % (ref 39.0–52.0)
Hemoglobin: 15.7 g/dL (ref 13.0–17.0)
MCH: 29.9 pg (ref 26.0–34.0)
MCHC: 34.7 g/dL (ref 30.0–36.0)
MCV: 86.1 fL (ref 78.0–100.0)
PLATELETS: 280 10*3/uL (ref 150–400)
RBC: 5.25 MIL/uL (ref 4.22–5.81)
RDW: 12.7 % (ref 11.5–15.5)
WBC: 9 10*3/uL (ref 4.0–10.5)

## 2014-01-24 MED ORDER — ALBUTEROL SULFATE (2.5 MG/3ML) 0.083% IN NEBU: 2.5000 mg | INHALATION_SOLUTION | Freq: Four times a day (QID) | RESPIRATORY_TRACT | Status: DC | PRN

## 2014-01-24 MED ORDER — LORATADINE 10 MG PO TABS
10.0000 mg | ORAL_TABLET | Freq: Every day | ORAL | Status: DC
  Administered 2014-01-24 – 2014-01-25 (×2): 10 mg via ORAL
  Filled 2014-01-24 (×2): qty 1

## 2014-01-24 MED ORDER — PAROXETINE HCL 20 MG PO TABS
20.0000 mg | ORAL_TABLET | Freq: Every day | ORAL | Status: DC
  Administered 2014-01-24 – 2014-01-25 (×2): 20 mg via ORAL
  Filled 2014-01-24 (×2): qty 1

## 2014-01-24 MED ORDER — ALBUTEROL SULFATE HFA 108 (90 BASE) MCG/ACT IN AERS: 2.0000 | INHALATION_SPRAY | Freq: Four times a day (QID) | RESPIRATORY_TRACT | Status: DC | PRN

## 2014-01-24 MED ORDER — CIPROFLOXACIN-DEXAMETHASONE 0.3-0.1 % OT SUSP
4.0000 [drp] | Freq: Two times a day (BID) | OTIC | Status: DC
  Administered 2014-01-24 – 2014-01-25 (×3): 4 [drp] via OTIC
  Filled 2014-01-24 (×2): qty 7.5

## 2014-01-24 MED ORDER — TRAMADOL HCL 50 MG PO TABS
ORAL_TABLET | ORAL | Status: AC
  Filled 2014-01-24: qty 1

## 2014-01-24 MED ORDER — CLOPIDOGREL BISULFATE 75 MG PO TABS
75.0000 mg | ORAL_TABLET | Freq: Every day | ORAL | Status: DC
  Administered 2014-01-24 – 2014-01-25 (×2): 75 mg via ORAL
  Filled 2014-01-24 (×2): qty 1

## 2014-01-24 MED ORDER — NAPROXEN 250 MG PO TABS
500.0000 mg | ORAL_TABLET | ORAL | Status: AC
  Administered 2014-01-24: 500 mg via ORAL
  Filled 2014-01-24: qty 2

## 2014-01-24 MED ORDER — LOSARTAN POTASSIUM 50 MG PO TABS
50.0000 mg | ORAL_TABLET | Freq: Every day | ORAL | Status: DC
  Administered 2014-01-24 – 2014-01-25 (×2): 50 mg via ORAL
  Filled 2014-01-24 (×2): qty 1

## 2014-01-24 MED ORDER — TRIAMCINOLONE ACETONIDE 55 MCG/ACT NA AERO
2.0000 | INHALATION_SPRAY | Freq: Every day | NASAL | Status: DC
  Administered 2014-01-24 – 2014-01-25 (×2): 2 via NASAL
  Filled 2014-01-24 (×2): qty 21.6

## 2014-01-24 MED ORDER — ONDANSETRON HCL 4 MG/2ML IJ SOLN
4.0000 mg | Freq: Four times a day (QID) | INTRAMUSCULAR | Status: DC | PRN
  Administered 2014-01-24 – 2014-01-25 (×2): 4 mg via INTRAVENOUS
  Filled 2014-01-24 (×2): qty 2

## 2014-01-24 MED ORDER — FAMOTIDINE 20 MG PO TABS
20.0000 mg | ORAL_TABLET | Freq: Two times a day (BID) | ORAL | Status: DC
  Administered 2014-01-24 – 2014-01-25 (×3): 20 mg via ORAL
  Filled 2014-01-24 (×4): qty 1

## 2014-01-24 NOTE — Progress Notes (Signed)
VASCULAR LAB PRELIMINARY  PRELIMINARY  PRELIMINARY  PRELIMINARY  Carotid Dopplers completed.    Preliminary report:  1-39% ICA stenosis.  Vertebral artery flow is antegrade.   Nakeem Murnane, RVT 01/24/2014, 3:46 PM

## 2014-01-24 NOTE — Discharge Summary (Signed)
Stroke Discharge Summary  Patient ID: Fernando Goodwin   MRN: [id number]      DOB: 11-Apr-1965  Date of Admission: 01/22/2014 Date of Discharge: 01/25/2014  Attending Physician:  Marvel Plan, MD, Stroke MD  Consulting Physician(s):   Treatment Team:  [facility] Stroke, MD Dr Pearlean Brownie Patient's PCP:  Dr Marcello Fennel [facility])  DISCHARGE DIAGNOSIS: Right brain subcortical TIA Active Problems:     BMI: Body mass index is 30.57 kg/(m^2).  Past Medical History  Diagnosis Date  . Hypertension   . Arthritis    Past Surgical History  Procedure Laterality Date  . Hernia repair        Medication List    STOP taking these medications        aspirin 325 MG tablet     aspirin EC 81 MG tablet     lisinopril 10 MG tablet  Commonly known as:  PRINIVIL,ZESTRIL     pantoprazole 40 MG tablet  Commonly known as:  PROTONIX     pseudoephedrine 120 MG 12 hr tablet  Commonly known as:  SUDAFED      TAKE these medications        acetaminophen 325 MG tablet  Commonly known as:  TYLENOL  Take 2 tablets (650 mg total) by mouth every 6 (six) hours as needed for mild pain.     atorvastatin 20 MG tablet  Commonly known as:  LIPITOR  Take 1 tablet (20 mg total) by mouth daily at 6 PM.     Cetirizine HCl 10 MG Caps  Take 10 mg by mouth daily.     CIPRODEX otic suspension  Generic drug:  ciprofloxacin-dexamethasone  Place 4 drops into the right ear 2 (two) times daily.     clopidogrel 75 MG tablet  Commonly known as:  PLAVIX  Take 1 tablet (75 mg total) by mouth daily.     COQ10 PO  Take 1 capsule by mouth daily.     cyclobenzaprine 10 MG tablet  Commonly known as:  FLEXERIL  Take 10 mg by mouth at bedtime as needed (for sleep).     famotidine 20 MG tablet  Commonly known as:  PEPCID  Take 1 tablet (20 mg total) by mouth 2 (two) times daily.     losartan-hydrochlorothiazide 50-12.5 MG per tablet  Commonly known as:  HYZAAR  Take 1 tablet by mouth daily.     naproxen 500 MG  tablet  Commonly known as:  NAPROSYN  Take 500 mg by mouth daily.     NASACORT ALLERGY 24HR 55 MCG/ACT Aero nasal inhaler  Generic drug:  triamcinolone  Inhale 2 sprays into the lungs daily as needed (for congestion).     PARoxetine 20 MG tablet  Commonly known as:  PAXIL  Take 20 mg by mouth daily.     PROAIR HFA 108 (90 BASE) MCG/ACT inhaler  Generic drug:  albuterol  Inhale 2 puffs into the lungs every 6 (six) hours as needed.        LABORATORY STUDIES CBC    Component Value Date/Time   WBC 9.0 01/24/2014 1716   RBC 5.25 01/24/2014 1716   HGB 15.7 01/24/2014 1716   HCT 45.2 01/24/2014 1716   PLT 280 01/24/2014 1716   MCV 86.1 01/24/2014 1716   MCH 29.9 01/24/2014 1716   MCHC 34.7 01/24/2014 1716   RDW 12.7 01/24/2014 1716   LYMPHSABS 2.3 01/22/2014 1512   MONOABS 0.8 01/22/2014 1512   EOSABS 0.1 01/22/2014  1512   BASOSABS 0.1 01/22/2014 1512   CMP    Component Value Date/Time   NA 136* 01/24/2014 1716   K 4.2 01/24/2014 1716   CL 97 01/24/2014 1716   CO2 25 01/24/2014 1716   GLUCOSE 113* 01/24/2014 1716   BUN 12 01/24/2014 1716   CREATININE 0.92 01/24/2014 1716   CALCIUM 9.0 01/24/2014 1716   PROT 7.5 01/22/2014 1512   ALBUMIN 4.2 01/22/2014 1512   AST 22 01/22/2014 1512   ALT 29 01/22/2014 1512   ALKPHOS 118* 01/22/2014 1512   BILITOT 0.5 01/22/2014 1512   GFRNONAA >90 01/24/2014 1716   GFRAA >90 01/24/2014 1716   COAGS Lab Results  Component Value Date   INR 0.92 01/22/2014   Lipid Panel    Component Value Date/Time   CHOL 170 01/23/2014 0540   TRIG 267* 01/23/2014 0540   HDL 39* 01/23/2014 0540   CHOLHDL 4.4 01/23/2014 0540   VLDL 53* 01/23/2014 0540   LDLCALC 78 01/23/2014 0540   HgbA1C  Lab Results  Component Value Date   HGBA1C 5.5 01/23/2014   Cardiac Panel (last 3 results) No results for input(s): CKTOTAL, CKMB, TROPONINI, RELINDX in the last 72 hours. Urinalysis    Component Value Date/Time   COLORURINE YELLOW 01/22/2014  1545   APPEARANCEUR CLEAR 01/22/2014 1545   LABSPEC 1.009 01/22/2014 1545   PHURINE 6.5 01/22/2014 1545   GLUCOSEU NEGATIVE 01/22/2014 1545   HGBUR NEGATIVE 01/22/2014 1545   BILIRUBINUR NEGATIVE 01/22/2014 1545   KETONESUR NEGATIVE 01/22/2014 1545   PROTEINUR NEGATIVE 01/22/2014 1545   UROBILINOGEN 0.2 01/22/2014 1545   NITRITE NEGATIVE 01/22/2014 1545   LEUKOCYTESUR NEGATIVE 01/22/2014 1545   Urine Drug Screen     Component Value Date/Time   LABOPIA NONE DETECTED 01/22/2014 1543   COCAINSCRNUR NONE DETECTED 01/22/2014 1543   LABBENZ NONE DETECTED 01/22/2014 1543   AMPHETMU NONE DETECTED 01/22/2014 1543   THCU NONE DETECTED 01/22/2014 1543   LABBARB NONE DETECTED 01/22/2014 1543    Alcohol Level    Component Value Date/Time   ETH <11 01/22/2014 1512     SIGNIFICANT DIAGNOSTIC STUDIES  Ct Head Wo Contrast 01/22/2014  Stable and normal noncontrast CT appearance of the brain.   Ct Head Wo Contrast 01/22/2014  Normal head CT.   MRI / MRA  01/23/2014 Negative MRI and MRA. Good general agreement prior normal CT.  Carotid ultrasound - 1-39% ICA stenosis. Vertebral artery flow is antegrade.  2D echo - pending. The computer system for the echocardiograms is currently down secondary to a systemwide malfunction. Results are not available at this time although the patient's echo has been performed. He will be contacted if there are any abnormal findings.    HISTORY OF PRESENT ILLNESS  Fernando Goodwin is a 48 y.o. male who was in his normal state of health on the morning of 01/22/2014, however at around 1:30 PM he noticed a significant left-sided weakness. He states that it came on suddenly and EMS was called. Shortly before arrival in the hospital, he rapidly began to improve, but did not return completely to baseline. At initial examination, he still had some mild sensory change on his left face, left leg, and left hand weakness that were  felt to be nondisabling.( NIHSS  1) After discussion with both him and his wife and Dr. Pearlean Brownie they decided to go ahead with PRISMS trial. (TPA versus no TPA in patients too mild to treat) Of note, he has rheumatoid arthritis and has  been on Enbrel in the past. Patient was not considered for on label TPA secondary to symptomatic improvement. He was admitted for further evaluation and treatment.  HOSPITAL COURSE Following enrollment in the PRISMS trial the patient was admitted to the hospital. Shortly after admission he developed a headache which was treated symptomatically. He did have intermittent headaches throughout his stay. He was also bothered by nausea and at one point vomited. Again this was treated symptomatically.  It was believed that the patient had a small subcortical right brain infarct secondary to small vessel disease versus a TIA. An MRI later demonstrated no infarct as noted above.  The patient's LDL was 78; however, the current goal for a stroke patient is an LDL less than 70. Lipitor therapy was initiated. The patient's body mass index was consistent with obesity and it was recommended that he try to lose weight. The patient had been on aspirin 81 mg prior to admission. 24 hours after initiating PRISMS medication a CT of the head, as well as an MRI,  was performed that showed no bleeding. Since the patient had essentially failed aspirin therapy a decision was made to place him on Plavix 75 mg daily. His protonix was changed to Pepcid due to concerns that the Protonix would interfere with the effectiveness of the Plavix.  The patient was evaluated by the physical, occupational, and speech therapists. No follow-up therapies were felt to be indicated.  The patient's blood pressure medications were held at time of admission to allow for permissive hypertension in the setting of a possible acute stroke. The patient's blood pressure remained labile throughout his stay. Eventually losartan was resumed although the patient  was instructed to continue to hold his Lotensin until this could be evaluated on an outpatient basis by his primary care physician. The goal at this time would be for a normal blood pressure range.  On the day of discharge a workup for a possible pheochromocytoma was initiated. Plasma metanephrines, aldosterone and renin activity with ratio, and fractionated plasma catecholamines were ordered. The lab informed us that the catecholamines would need to be performed fasting. We did not want to delay the patient's discharge to complete the blood work so it was hoped that the patient's primary care physician could have the fractionated catecholamines performed fasting on an outpatient basis.  Part of the patient's workup included a 2-D echocardiogram. This study was performed during his stay, unfortunately though the reading system was down for several days and the study could not be interpreted. If any abnormalities are found the patient will be contacted.  Arrangements were eventually made to discharge the patient in stable and improved condition on December 6th with plans for early follow-up with his primary care physician.  DISCHARGE EXAM Blood pressure 108/66, pulse 77, temperature 98.1 F (36.7 C), temperature source Oral, resp. rate 18, height 5\' 8"  (1.727 m), weight 201 lb (91.173 kg), SpO2 96 %. Pleasant middle aged Caucasian male not in distress,Awake alert. Afebrile. Head is nontraumatic. Neck is supple without bruit. Hearing is normal. Cardiac exam no murmur or gallop. Lungs are clear to auscultation. Distal pulses are well felt. Neurological Exam ;  Awake Alert oriented x 3. Normal speech and language.eye movements full without nystagmus.fundi were not visualized. Vision acuity and fields appear normal. Hearing is normal. Palatal movements are normal. Face symmetric. Tongue midline. Normal strength, tone, reflexes and coordination. Normal sensation. Gait deferred.  Discharge Diet   Diet  Heart Diet - low sodium heart healthy with  thin liquids  Patient discharge instructions 1. Obtain a blood pressure home monitoring device. Check blood pressure every morning and every evening and write down the results. Bring this information to your next primary care visit for further adjustment of blood pressure medications. 2. Do not take decongestants such as Sudafed ( pseudoephedrine ) as this can cause an abnormal increase in your blood pressure. 3. Try to see your primary care physician within the next week for further evaluation of blood pressure and medications. 4. Do not take Protonix as this medication will interfere with Plavix. Take Pepcid instead. 5. Do not return to work for at least 2 weeks. 6. Gradually increase your activity and avoid any strenuous activities for at least 1-2 weeks. 7. Limit your driving initially until feeling back to normal. 8. Your echocardiogram results and blood work to evaluate for possible pheochromocytoma are currently pending. 9. Plasma fractionated catecholamines (blood test for pheochromocytoma) must be performed fasting (on an empty stomach). Perhaps your primary care physician could make arrangements for this blood test. 10. Do not take lisinopril for now unless instructed by her physician. 11. Try to lose some weight.  DISCHARGE PLAN  Disposition:  Discharged to home  clopidogrel 75 mg orally every day for secondary stroke prevention.  Follow-up with Dr Marcello Fennel this week  Follow-up with Delia Heady, Stroke Clinic in 1 month.   35 minutes were spent preparing discharge.  Delton See PA-C [facility] Pager [phone] 01/25/2014, 4:48 PM   I, the attending vascular neurologist, have personally obtained a history, examined the patient, evaluated laboratory data, individually viewed imaging studies. Together with the NP/PA, we formulated the assessment and plan of care which reflects our mutual decision.  I have made any  additions or clarifications directly to the above note and agree with the findings and plan as currently documented.   Marvel Plan, MD PhD Stroke Neurology 01/25/2014 11:04 PM

## 2014-01-24 NOTE — Progress Notes (Signed)
OT Cancellation Note  Patient Details Name: Fernando Goodwin MRN: [id number] DOB: Jun 11, 1965   Cancelled Treatment:    Reason Eval/Treat Not Completed: OT screened, no needs identified, will sign off. Pt seen by PT yesterday and at a mod I level. SLP saw pt today and no SLP needs identified. Pt reports that his arm/hand is back to normal and so is his vision. No OT needs identified. I did encourage pt to get up and walk around in the hallways and not just lay in bed/stay in his room. He reports that he is starting to have a headache again on the left side, I took BP (see flow sheet) and it is a little elevated from last one at 10:45 am--made covering nurse Marylene Land) aware.  Evette Georges [phone] 01/24/2014, 1:18 PM

## 2014-01-24 NOTE — Progress Notes (Addendum)
STROKE TEAM PROGRESS NOTE   HISTORY Fernando Goodwin is a 48 y.o. male who was in his normal state of health this morning 01/22/2014, however around 1:30 PM he noticed a significant left-sided weakness. He states that it came on suddenly and EMS was called. Shortly before arrival in the hospital, he rapidly begin to improve, but did not return completely to baseline. At initial examination, he still had some mild sensory change on his left face as well as left leg and left hand weakness that were not felt to be nondisabling.( NIHSS 1) After discussion with both him and his wife with Dr. Pearlean Brownie they decided to go ahead with PRISMS trial. Of note, he has rheumatoid arthritis and has been on Enbrel in the past. Patient was not considered for on label TPA secondary to symptomatic improvement. He was admitted for further evaluation and treatment.   SUBJECTIVE (INTERVAL HISTORY) The patient's wife is present. The patient states he feels drowsy today. He has been sleeping well. He feels he is essentially back to baseline. Will initiate Plavix therapy.    OBJECTIVE Temp:  [97 F (36.1 C)-98 F (36.7 C)] 98 F (36.7 C) (12/05 0530) Pulse Rate:  [74-87] 80 (12/05 0849) Cardiac Rhythm:  [-]  Resp:  [16-18] 18 (12/05 0530) BP: (113-159)/(75-106) 131/83 mmHg (12/05 0849) SpO2:  [97 %-99 %] 98 % (12/05 0530)   Recent Labs Lab 01/22/14 1611  GLUCAP 121*    Recent Labs Lab 01/22/14 1512 01/22/14 1516  NA 136* 136*  K 4.4 4.2  CL 96 99  CO2 25  --   GLUCOSE 122* 127*  BUN 12 12  CREATININE 0.89 0.90  CALCIUM 9.1  --     Recent Labs Lab 01/22/14 1512  AST 22  ALT 29  ALKPHOS 118*  BILITOT 0.5  PROT 7.5  ALBUMIN 4.2    Recent Labs Lab 01/22/14 1512 01/22/14 1516  WBC 9.8  --   NEUTROABS 6.6  --   HGB 16.7 17.3*  HCT 46.9 51.0  MCV 85.7  --   PLT 324  --    No results for input(s): CKTOTAL, CKMB, CKMBINDEX, TROPONINI in the last 168 hours.  Recent Labs  01/22/14 1512   LABPROT 12.5  INR 0.92    Recent Labs  01/22/14 1545  COLORURINE YELLOW  LABSPEC 1.009  PHURINE 6.5  GLUCOSEU NEGATIVE  HGBUR NEGATIVE  BILIRUBINUR NEGATIVE  KETONESUR NEGATIVE  PROTEINUR NEGATIVE  UROBILINOGEN 0.2  NITRITE NEGATIVE  LEUKOCYTESUR NEGATIVE       Component Value Date/Time   CHOL 170 01/23/2014 0540   TRIG 267* 01/23/2014 0540   HDL 39* 01/23/2014 0540   CHOLHDL 4.4 01/23/2014 0540   VLDL 53* 01/23/2014 0540   LDLCALC 78 01/23/2014 0540   Lab Results  Component Value Date   HGBA1C 5.5 01/23/2014      Component Value Date/Time   LABOPIA NONE DETECTED 01/22/2014 1543   COCAINSCRNUR NONE DETECTED 01/22/2014 1543   LABBENZ NONE DETECTED 01/22/2014 1543   AMPHETMU NONE DETECTED 01/22/2014 1543   THCU NONE DETECTED 01/22/2014 1543   LABBARB NONE DETECTED 01/22/2014 1543     Recent Labs Lab 01/22/14 1512  ETH <11   I have personally reviewed the radiological images below and agree with the radiology interpretations.  Ct Head Wo Contrast 01/22/2014    Stable and normal noncontrast CT appearance of the brain.   Ct Head Wo Contrast 01/22/2014  Normal head CT.   MRI / MRA  01/23/2014 Negative MRI and MRA. Good general agreement prior normal CT.  CUS - 1-39% ICA stenosis. Vertebral artery flow is antegrade.  2D echo - pending  PHYSICAL EXAM Pleasant middle aged Caucasian male not in distress,Awake alert. Afebrile. Head is nontraumatic. Neck is supple without bruit. Hearing is normal. Cardiac exam no murmur or gallop. Lungs are clear to auscultation. Distal pulses are well felt.  Neurological Exam ;  Awake  Alert oriented x 3. Normal speech and language.eye movements full without nystagmus.fundi were not visualized. Vision acuity and fields appear normal. Hearing is normal. Palatal movements are normal. Face symmetric. Tongue midline. Normal strength, tone, reflexes and coordination. Normal sensation. Gait deferred.  ASSESSMENT/PLAN Mr.  JABARRI STEFANELLI is a 48 y.o. male with history of RA and hypertension presenting with left sided weakness. He did not receive IV t-PA per standard protocol due to symptom improvement; he was then enrolled in the PRISMS trial (tPA vs no tPA in pts too mild to treat). Pt came in with left sided numbness with subsequent headache with left eye blurry vision, deficit resolved with headache. MRI negative, complicated migraine not able to completely ruled out, but we will treat for possible TIA.   Stroke:  Non-dominant right brain subcortical TIA s/p PRISMS enrollment vs. Complicated miagraine   Resultant  Resolution of deficit  MRI  negative  MRA  negative  Carotid Doppler  Unremarkable  2D Echo  pending  HgbA1c 5.5, at the goal  LDL 78, not at goal < 70  SCDs for VTE prophylaxis  Diet Heart thin liquids  aspirin 81 mg orally every day prior to admission, received aspirin vs placebo yesterday for antithrombotic (Prisms protocol, based on tPA vs no tPA, blinded) now on no antithrombotics. Add plavix today.   Patient counseled to be compliant with his antithrombotic medications  Ongoing aggressive stroke risk factor management  Therapy recommendations:  No follow-up physical therapy recommended  Disposition:  Probable discharge to home Saturday.  Hypertension  Home meds:   Lisinopril, hyzaar  Stable  BP labile will hold off BP meds today  Hyperlipidemia  Home meds:  No statin  LDL 78, goal < 70  Lipitor added  Other Stroke Risk Factors  Obesity, Body mass index is 30.57 kg/(m^2).   Other Active Problems  RA - on Enbrel for about a year.  Contacted by the patient's nurse. The patient experienced nausea and vomiting after lunch. Will order PRN Zofran.  Resume home medications per patient request. We will hold antihypertensive medications and Sudafed for now.  Anticipate discharge later today.  Hospital day # 2  Delton See PA-C [facility] Pager [phone] 01/24/2014, 10:02 AM   I, the attending vascular neurologist, have personally obtained a history, examined the patient, evaluated laboratory data, individually viewed imaging studies. Together with the NP/PA, we formulated the assessment and plan of care which reflects our mutual decision.  I have made any additions or clarifications directly to the above note and agree with the findings and plan as currently documented.   Please note: All text in blue color in the note is my addition to the original note.   Marvel Plan, MD PhD Stroke Neurology 01/24/2014 5:49 PM      To contact Stroke Continuity provider, please refer to [email or web address]. After hours, contact General Neurology

## 2014-01-25 DIAGNOSIS — G459 Transient cerebral ischemic attack, unspecified: Principal | ICD-10-CM

## 2014-01-25 DIAGNOSIS — I674 Hypertensive encephalopathy: Secondary | ICD-10-CM

## 2014-01-25 MED ORDER — CLOPIDOGREL BISULFATE 75 MG PO TABS: 75.0000 mg | ORAL_TABLET | Freq: Every day | ORAL | Status: AC

## 2014-01-25 MED ORDER — ACETAMINOPHEN 325 MG PO TABS: 650.0000 mg | ORAL_TABLET | Freq: Four times a day (QID) | ORAL | Status: DC | PRN

## 2014-01-25 MED ORDER — FAMOTIDINE 20 MG PO TABS: 20.0000 mg | ORAL_TABLET | Freq: Two times a day (BID) | ORAL | Status: DC

## 2014-01-25 MED ORDER — ATORVASTATIN CALCIUM 20 MG PO TABS: 20.0000 mg | ORAL_TABLET | Freq: Every day | ORAL | Status: DC

## 2014-01-25 NOTE — Progress Notes (Signed)
Patient DC'd home via car with wife.  DC instructions and prescriptions given to patient.  Vital signs and assessments were stable prior

## 2014-01-25 NOTE — Progress Notes (Signed)
STROKE TEAM PROGRESS NOTE   HISTORY Fernando Goodwin is a 48 y.o. male who was in his normal state of health this morning 01/22/2014, however around 1:30 PM he noticed a significant left-sided weakness. He states that it came on suddenly and EMS was called. Shortly before arrival in the hospital, he rapidly begin to improve, but did not return completely to baseline. At initial examination, he still had some mild sensory change on his left face as well as left leg and left hand weakness that were not felt to be nondisabling.( NIHSS 1) After discussion with both him and his wife with Dr. Pearlean Brownie they decided to go ahead with PRISMS trial. Of note, he has rheumatoid arthritis and has been on Enbrel in the past. Patient was not considered for on label TPA secondary to symptomatic improvement. He was admitted for further evaluation and treatment.   SUBJECTIVE (INTERVAL HISTORY) The patient's wife is at bedside. The patient is feeling much better today. He feels ready for discharge. Dr Roda Shutters discussed the patient's labile blood pressure. It was felt that a pheochromocytoma should be ruled out. The patient can be discharged once he labs up in drawn. We will discharge the patient on only one of his antihypertensive medications as he is mildly hypotensive at times. He will need early follow-up with his primary care physician. The patient was instructed to record his blood pressure at home and to bring this record to his primary care physician for further evaluation and adjustment of blood pressure medications.   OBJECTIVE Temp:  [97.9 F (36.6 C)-98.4 F (36.9 C)] 98.1 F (36.7 C) (12/06 1010) Pulse Rate:  [72-84] 77 (12/06 1010) Cardiac Rhythm:  [-]  Resp:  [18-20] 18 (12/06 1010) BP: (108-160)/(66-98) 108/66 mmHg (12/06 1010) SpO2:  [93 %-98 %] 96 % (12/06 1010)   Recent Labs Lab 01/22/14 1611  GLUCAP 121*    Recent Labs Lab 01/22/14 1512 01/22/14 1516 01/24/14 1716  NA 136* 136* 136*  K 4.4  4.2 4.2  CL 96 99 97  CO2 25  --  25  GLUCOSE 122* 127* 113*  BUN 12 12 12   CREATININE 0.89 0.90 0.92  CALCIUM 9.1  --  9.0    Recent Labs Lab 01/22/14 1512  AST 22  ALT 29  ALKPHOS 118*  BILITOT 0.5  PROT 7.5  ALBUMIN 4.2    Recent Labs Lab 01/22/14 1512 01/22/14 1516 01/24/14 1716  WBC 9.8  --  9.0  NEUTROABS 6.6  --   --   HGB 16.7 17.3* 15.7  HCT 46.9 51.0 45.2  MCV 85.7  --  86.1  PLT 324  --  280   No results for input(s): CKTOTAL, CKMB, CKMBINDEX, TROPONINI in the last 168 hours.  Recent Labs  01/22/14 1512  LABPROT 12.5  INR 0.92    Recent Labs  01/22/14 1545  COLORURINE YELLOW  LABSPEC 1.009  PHURINE 6.5  GLUCOSEU NEGATIVE  HGBUR NEGATIVE  BILIRUBINUR NEGATIVE  KETONESUR NEGATIVE  PROTEINUR NEGATIVE  UROBILINOGEN 0.2  NITRITE NEGATIVE  LEUKOCYTESUR NEGATIVE       Component Value Date/Time   CHOL 170 01/23/2014 0540   TRIG 267* 01/23/2014 0540   HDL 39* 01/23/2014 0540   CHOLHDL 4.4 01/23/2014 0540   VLDL 53* 01/23/2014 0540   LDLCALC 78 01/23/2014 0540   Lab Results  Component Value Date   HGBA1C 5.5 01/23/2014      Component Value Date/Time   LABOPIA NONE DETECTED 01/22/2014 1543  COCAINSCRNUR NONE DETECTED 01/22/2014 1543   LABBENZ NONE DETECTED 01/22/2014 1543   AMPHETMU NONE DETECTED 01/22/2014 1543   THCU NONE DETECTED 01/22/2014 1543   LABBARB NONE DETECTED 01/22/2014 1543     Recent Labs Lab 01/22/14 1512  ETH <11   I have personally reviewed the radiological images below and agree with the radiology interpretations.  Ct Head Wo Contrast 01/22/2014    Stable and normal noncontrast CT appearance of the brain.   Ct Head Wo Contrast 01/22/2014  Normal head CT.   MRI / MRA  01/23/2014 Negative MRI and MRA. Good general agreement prior normal CT.  CUS - 1-39% ICA stenosis. Vertebral artery flow is antegrade.  2D echo - pending. The computer system for the echocardiograms is currently being repaired.  Results are not available at this time although the patient's echo has been performed. He will be contacted if there are any abnormal findings.  PHYSICAL EXAM Pleasant middle aged Caucasian male not in distress,Awake alert. Afebrile. Head is nontraumatic. Neck is supple without bruit. Hearing is normal. Cardiac exam no murmur or gallop. Lungs are clear to auscultation. Distal pulses are well felt.  Neurological Exam ;  Awake  Alert oriented x 3. Normal speech and language.eye movements full without nystagmus.fundi were not visualized. Vision acuity and fields appear normal. Hearing is normal. Palatal movements are normal. Face symmetric. Tongue midline. Normal strength, tone, reflexes and coordination. Normal sensation. Gait deferred.  ASSESSMENT/PLAN Fernando Goodwin is a 48 y.o. male with history of RA and hypertension presenting with left sided weakness. He did not receive IV t-PA per standard protocol due to symptom improvement; he was then enrolled in the PRISMS trial (tPA vs no tPA in pts too mild to treat). Pt came in with left sided numbness with subsequent headache with left eye blurry vision, deficit resolved with headache. MRI negative, complicated migraine not able to completely ruled out, but we will treat for possible TIA.   Stroke:  Non-dominant right brain subcortical TIA s/p PRISMS enrollment vs. Complicated miagraine   Resultant  Resolution of deficit  MRI  negative  MRA  negative  Carotid Doppler  Unremarkable  2D Echo  Pending - please see above remarks.  HgbA1c 5.5, at the goal  LDL 78, not at goal < 70  SCDs for VTE prophylaxis  Diet Heart thin liquids  aspirin 81 mg orally every day prior to admission, received aspirin vs placebo yesterday for antithrombotic (Prisms protocol, based on tPA vs no tPA, blinded) now on Plavix.  Patient counseled to be compliant with his antithrombotic medications  Ongoing aggressive stroke risk factor management  Therapy  recommendations:  No follow-up physical therapy recommended  Disposition:  Discharge home today.  Hypertension  Home meds:  Lisinopril, hyzaar  Not stable, BP fluctuating.   Resume cozar on discharge  Check catecholamines and renin-aldosterone to rule out secondary HTN   Hyperlipidemia  Home meds:  No statin  LDL 78, goal < 70  Lipitor added  Other Stroke Risk Factors  Obesity, Body mass index is 30.57 kg/(m^2).   Other Active Problems  RA - on Enbrel for about a year.   Hospital day # 3  Delton See [facility] Pager [phone] 01/25/2014, 10:21 AM   I, the attending vascular neurologist, have personally obtained a history, examined the patient, evaluated laboratory data, individually viewed imaging studies. Together with the NP/PA, we formulated the assessment and plan of care which reflects our mutual decision.  I have made any additions or clarifications directly to the above note and agree with the findings and plan as currently documented.   Please note: All text in blue color in the note is my addition to the original note.   Marvel Plan, MD PhD Stroke Neurology 01/25/2014 11:02 PM     To contact Stroke Continuity provider, please refer to [email or web address]. After hours, contact General Neurology

## 2014-01-25 NOTE — Progress Notes (Signed)
Utilization Review Completed.Fernando Goodwin T12/07/2013  

## 2014-01-25 NOTE — Discharge Instructions (Signed)
1. Obtain a blood pressure home monitoring device. Check blood pressure every morning and every evening and write down the results. Bring this information to your next primary care visit for further adjustment of blood pressure medications. 2. Do not take decongestants such as Sudafed ( pseudoephedrine ) as this can cause an abnormal increase in your blood pressure. 3. Try to see your primary care physician within the next week for further evaluation of blood pressure and medications. 4. Do not take Protonix as this medication will interfere with Plavix. Take Pepcid instead. 5. Do not return to work for at least 2 weeks. 6. Gradually increase your activity and avoid any strenuous activities for at least 1-2 weeks. 7. Limit your driving initially until feeling back to normal. 8. Your echocardiogram results and blood work to evaluate for possible pheochromocytoma are currently pending. 9. Plasma fractionated catecholamines (blood test for pheochromocytoma) must be performed fasting (on an empty stomach). Perhaps your primary care physician could make arrangements for this blood test. 10. Do not take lisinopril for now unless instructed by her physician. 11. Try to lose some weight.

## 2014-01-29 ENCOUNTER — Encounter: Payer: Self-pay | Admitting: Neurology

## 2014-01-29 LAB — ALDOSTERONE + RENIN ACTIVITY W/ RATIO
ALDO / PRA Ratio: 0.1 Ratio — ABNORMAL LOW (ref 0.9–28.9)
Aldosterone: 1 ng/dL
PRA LC/MS/MS: 8.67 ng/mL/h — AB (ref 0.25–5.82)

## 2014-02-02 ENCOUNTER — Encounter: Payer: Self-pay | Admitting: Neurology

## 2014-02-03 ENCOUNTER — Telehealth: Payer: Self-pay | Admitting: *Deleted

## 2014-02-03 NOTE — Telephone Encounter (Signed)
Form,[employer] Dr Roda Shutters and Butler Denmark completed and faxed form 02/03/14.

## 2014-02-24 ENCOUNTER — Telehealth: Payer: Self-pay | Admitting: Neurology

## 2014-02-24 NOTE — Telephone Encounter (Signed)
I attempted without success to contact the patient to schedule his Visit 5 for the PRISMS research Trial. I left the patient a message to call me back.

## 2014-02-25 ENCOUNTER — Telehealth: Payer: Self-pay | Admitting: Neurology

## 2014-02-25 NOTE — Telephone Encounter (Signed)
I spoke to the patient to schedule Visit 5 for the PRISMS trial. Appointment was scheduled for [id number] at 8:15h.

## 2014-03-27 ENCOUNTER — Telehealth: Payer: Self-pay | Admitting: *Deleted

## 2014-03-27 NOTE — Telephone Encounter (Signed)
Patient returned my call appointment was r/s to 03/31/14 at 11:45 with Dr Roda Shutters

## 2014-03-27 NOTE — Telephone Encounter (Signed)
Called patient, he didn't answer, called other listed number and left message with patient's mother to have patient call back and r/s 2/18 appointment.

## 2014-03-31 ENCOUNTER — Encounter: Payer: Self-pay | Admitting: Neurology

## 2014-03-31 ENCOUNTER — Ambulatory Visit (INDEPENDENT_AMBULATORY_CARE_PROVIDER_SITE_OTHER): Payer: Managed Care, Other (non HMO) | Admitting: Neurology

## 2014-03-31 VITALS — BP 131/78 | HR 73 | Ht 69.0 in | Wt 204.4 lb

## 2014-03-31 DIAGNOSIS — G43909 Migraine, unspecified, not intractable, without status migrainosus: Secondary | ICD-10-CM

## 2014-03-31 DIAGNOSIS — I10 Essential (primary) hypertension: Secondary | ICD-10-CM

## 2014-03-31 DIAGNOSIS — G451 Carotid artery syndrome (hemispheric): Secondary | ICD-10-CM

## 2014-03-31 DIAGNOSIS — G43109 Migraine with aura, not intractable, without status migrainosus: Secondary | ICD-10-CM

## 2014-03-31 DIAGNOSIS — E785 Hyperlipidemia, unspecified: Secondary | ICD-10-CM

## 2014-03-31 NOTE — Patient Instructions (Signed)
-   continue plavix and lipitor for stroke prevention - continue BP control and compliant with BP meds - Follow up with your primary care physician for stroke risk factor modification. Recommend maintain blood pressure goal <130/80, diabetes with hemoglobin A1c goal below 6.5% and lipids with LDL cholesterol goal below 70 mg/dL.  - observe the frequency and severity of headache.  - follow up with the sleep study to rule out OSA - regular exercise and healthy diet - follow up in 6 months.

## 2014-04-01 DIAGNOSIS — G451 Carotid artery syndrome (hemispheric): Secondary | ICD-10-CM | POA: Insufficient documentation

## 2014-04-01 DIAGNOSIS — I10 Essential (primary) hypertension: Secondary | ICD-10-CM | POA: Insufficient documentation

## 2014-04-01 DIAGNOSIS — G43109 Migraine with aura, not intractable, without status migrainosus: Secondary | ICD-10-CM | POA: Insufficient documentation

## 2014-04-01 DIAGNOSIS — E785 Hyperlipidemia, unspecified: Secondary | ICD-10-CM | POA: Insufficient documentation

## 2014-04-01 NOTE — Progress Notes (Signed)
STROKE NEUROLOGY FOLLOW UP NOTE  NAME: GABERIAL CADA DOB: 1965-10-20  REASON FOR VISIT: stroke follow up HISTORY FROM: chart and pt  Today we had the pleasure of seeing Carma Lair in follow-up at our Neurology Clinic. Pt was accompanied by on one.   History Summary JAWAD WIACEK is a 49 y.o. male with history of HTN and RA on Enbrel was admitted to [facility] on 01/22/2014 due to acute onset left-sided numbness more than weakness. Shortly before arrival in the hospital, he rapidly begin to improve, but did not return completely to baseline. He was enrolled in PRISMS trial. The episode lasted about 1-2 hours and resolved. Of note, he also complained of mild headache, band-like from bifrontal to the back of head with left eye blurry vision. When he closed left eye, everything was OK. He denies photophobia, phonophobia or N/V. Headache lasted about 2-3 hours and resolved. He denies hx of migraine. MRI and MRA unremarkable and other stroke work up also unrevealing. He was discharged with TIA and was put on plavix and lipitor.   Interval History During the interval time, the patient has been doing well. No recurrent neuro symptoms. No Migraine. He continued on plavix and lipitor and tolerating well. His BP today 131/78, he currently on losartan, HCTZ and norvasc for BP control.   REVIEW OF SYSTEMS: Full 14 system review of systems performed and notable only for those listed below and in HPI above, all others are negative:  Constitutional:   Cardiovascular: chest pain Ear/Nose/Throat:   Skin:  Eyes:  Blurry vision Respiratory:   Gastroitestinal:   Genitourinary:  Hematology/Lymphatic:   Endocrine:  Musculoskeletal:  Joint pain, joint swelling, back pain, aching muscles, neck pain Allergy/Immunology:   Neurological:  Memory loss Psychiatric: agitation Sleep: restless leg, frequent waking, daytime sleepiness  The following represents the patient's updated allergies and side effects  list: No Known Allergies  The neurologically relevant items on the patient's problem list were reviewed on today's visit.  Neurologic Examination  A problem focused neurological exam (12 or more points of the single system neurologic examination, vital signs counts as 1 point, cranial nerves count for 8 points) was performed.  Blood pressure 131/78, pulse 73, height 5\' 9"  (1.753 m), weight 204 lb 6.4 oz (92.715 kg).  General - Well nourished, well developed, in no apparent distress.  Ophthalmologic - Sharp disc margins OU.  Cardiovascular - Regular rate and rhythm with no murmur.  Mental Status -  Level of arousal and orientation to time, place, and person were intact. Language including expression, naming, repetition, comprehension, reading, and writing was assessed and found intact. Attention span and concentration were normal. Recent and remote memory were intact. Fund of Knowledge was assessed and was intact.  Cranial Nerves II - XII - II - Visual field intact OU. III, IV, VI - Extraocular movements intact. V - Facial sensation intact bilaterally. VII - Facial movement intact bilaterally. VIII - Hearing & vestibular intact bilaterally. X - Palate elevates symmetrically. XI - Chin turning & shoulder shrug intact bilaterally. XII - Tongue protrusion intact.  Motor Strength - The patient's strength was normal in all extremities and pronator drift was absent.  Bulk was normal and fasciculations were absent.   Motor Tone - Muscle tone was assessed at the neck and appendages and was normal.  Reflexes - The patient's reflexes were normal in all extremities and he had no pathological reflexes.  Sensory - Light touch, temperature/pinprick, vibration and proprioception, and  Romberg testing were assessed and were normal.    Coordination - The patient had normal movements in the hands and feet with no ataxia or dysmetria.  Tremor was absent.  Gait and Station - The patient's  transfers, posture, gait, station, and turns were observed as normal.  Data reviewed: I personally reviewed the images and agree with the radiology interpretations.  Ct Head Wo Contrast 01/22/2014  Stable and normal noncontrast CT appearance of the brain.   Ct Head Wo Contrast 01/22/2014  Normal head CT.   MRI / MRA  01/23/2014 Negative MRI and MRA. Good general agreement prior normal CT.  CUS - 1-39% ICA stenosis. Vertebral artery flow is antegrade.  2D echo - pending report.  Component     Latest Ref Rng 01/23/2014  Cholesterol     0 - 200 mg/dL 824  Triglycerides     <150 mg/dL 235 (H)  HDL     >36 mg/dL 39 (L)  Total CHOL/HDL Ratio      4.4  VLDL     0 - 40 mg/dL 53 (H)  LDL (calc)     0 - 99 mg/dL 78  Hgb R4E MFr Bld     <5.7 % 5.5  Mean Plasma Glucose     <117 mg/dL 315    Assessment: As you may recall, he is a 49 y.o. Caucasian male with PMH of HTN and RA on Enbrel was admitted 01/22/14 for acute onset left sided numbness>weakess with HA and left eye blurry vision and rapid improving. No hx of migraine. He was enrolled in PRISM trial and symptoms resolved and MRI MRA negative. Stroke w/u negative. His episode considered as TIA vs. Complicated migraine. He was put on plavix and lipitor. He was doing well. BP in good control.   Plan:  - continue plavix and lipitor for stroke prevention - check BP at home and continue BP meds - Follow up with your primary care physician for stroke risk factor modification. Recommend maintain blood pressure goal <130/80, diabetes with hemoglobin A1c goal below 6.5% and lipids with LDL cholesterol goal below 70 mg/dL.  - has sleep study on 04/11/14 - RTC in 6 months.  No orders of the defined types were placed in this encounter.    Meds ordered this encounter  Medications  . amLODipine (NORVASC) 10 MG tablet    Sig: Take 10 mg by mouth daily.  . hydrochlorothiazide (MICROZIDE) 12.5 MG capsule    Sig: Take 12.5 mg by mouth  daily.  Marland Kitchen losartan-hydrochlorothiazide (HYZAAR) 100-12.5 MG per tablet    Sig: Take 1 tablet by mouth daily.    Patient Instructions  - continue plavix and lipitor for stroke prevention - continue BP control and compliant with BP meds - Follow up with your primary care physician for stroke risk factor modification. Recommend maintain blood pressure goal <130/80, diabetes with hemoglobin A1c goal below 6.5% and lipids with LDL cholesterol goal below 70 mg/dL.  - observe the frequency and severity of headache.  - follow up with the sleep study to rule out OSA - regular exercise and healthy diet - follow up in 6 months.   Marvel Plan, MD PhD [facility] [address], [address] [place], [place] [address] [phone]

## 2014-04-09 ENCOUNTER — Ambulatory Visit: Payer: Self-pay | Admitting: Neurology

## 2014-04-27 ENCOUNTER — Ambulatory Visit (INDEPENDENT_AMBULATORY_CARE_PROVIDER_SITE_OTHER): Payer: Self-pay | Admitting: Neurology

## 2014-04-27 DIAGNOSIS — I10 Essential (primary) hypertension: Secondary | ICD-10-CM

## 2014-04-27 DIAGNOSIS — G43909 Migraine, unspecified, not intractable, without status migrainosus: Secondary | ICD-10-CM

## 2014-04-27 DIAGNOSIS — G451 Carotid artery syndrome (hemispheric): Secondary | ICD-10-CM

## 2014-04-27 DIAGNOSIS — G43109 Migraine with aura, not intractable, without status migrainosus: Secondary | ICD-10-CM

## 2014-04-28 ENCOUNTER — Encounter: Payer: Self-pay | Admitting: Neurology

## 2014-04-28 NOTE — Progress Notes (Signed)
Pt came in for PRISM follow up visit. Pt had no complains. He is going to have 2nd sleep study to titrate CPAP this week. Otherwise, he is doing well.   NIHSS = 0  MRS = 0  Marvel Plan, MD PhD Stroke Neurology 04/28/2014 12:16 AM

## 2014-05-02 ENCOUNTER — Ambulatory Visit: Payer: Self-pay | Admitting: Internal Medicine

## 2014-06-13 NOTE — Discharge Summary (Signed)
PATIENT NAME:  Fernando Goodwin, Fernando Goodwin MR#:  [id number] DATE OF BIRTH:  10/16/1965  DATE OF ADMISSION:  04/18/2013 DATE OF DISCHARGE:  04/22/2013  DIAGNOSES AT TIME OF DISCHARGE: 1.  Chronic obstructive pulmonary disease exacerbation and acute bronchitis.  2.  Hypertension.  3.  Depression.  4.  History of Reiter syndrome.   CHIEF COMPLAINT:  Severe cough with syncopal episode.   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 The patient had been taking amoxicillin for the last few days, but continued to be symptomatic and also had a fever to 102. The patient was seen initially at the urgent care center, subsequently sent to the ED for further management.   PAST MEDICAL HISTORY: Significant for hypertension, depression, COPD, lung nodule which appears to be noncancerous, history of Reiter syndrome.   PAST SURGICAL HISTORY: Significant for hernia repair.   PHYSICAL EXAMINATION: VITAL SIGNS: Temperature was 98. Pulse was 93, respirations 18 to 20, blood pressure 155/97, pulse ox 93% on room air, then improved to 98% with supplemental oxygen.  GENERAL: He appeared to be in mild distress, drowsy.  HEENT: NCAT.  NECK: Supple.  HEART: S1, S2.  LUNGS: Bilateral inspiratory and expiratory wheezes.  ABDOMEN: Soft, nontender.  EXTREMITIES: No edema.   IMAGING AND LABORATORY DATA: Glucose 87, BUN 14, creatinine 1.08, sodium 136, potassium 4.1, chloride 103, CO2 of 29, calcium 8.3, total protein 7.6. WBC 5.1, hemoglobin 16.2, platelets 170. Influenzae A and B were negative. Chest x-ray showed a rounded density in the infrahilar region on the lateral film; not seen on the PA view. CT of the chest showed no acute thoracic abnormality.  CHEST X-RAY: No acute cardiopulmonary  disease.   The patient was admitted to [facility] and received antibiotics, nebulized bronchodilator treatment, IV steroids. His cardiac enzymes were negative. His white cell count did go up, which was felt to be due to steroids, but overall the patient started to make good progress and his shortness of breath and cough improved to a significant extent. He was discharged in stable condition on the following medications:  Prednisone taper starting at 40 mg a day for 3 days and decrease by 10 mg every 3 days until gone; Ceftin 250 mg p.o. b.i.d. for 7 days, azithromycin 250 mg once a day for 3 more days, ProAir HFA 2 puffs q.i.d. p.r.n., etanercept 50 mg/mL subQ once a week, aspirin 81 mg a day, citalopram 10 mg once a day and Zyrtec 10 mg once a day.   The patient has been advised to keep his followup appointment with Dr. Meredeth Ide and also follow up with me, Dr. Marcello Fennel, in 1 to 2 weeks' time. He has been advised to call back with any questions or concerns. The patient is stable at the time of discharge.   Total time spent on discharging this patient was 30 minutes.   ____________________________ Barbette Reichmann, MD vh:dmm D: 04/30/2013 13:09:16 ET T: 04/30/2013 13:25:19 ET JOB#: [id number]  cc: Barbette Reichmann, MD, <Dictator> Barbette Reichmann MD ELECTRONICALLY SIGNED 05/28/2013 13:39

## 2014-06-13 NOTE — H&P (Signed)
PATIENT NAME:  Fernando Goodwin, SEELMAN MR#:  [id number] DATE OF BIRTH:  Sep 20, 1965  DATE OF ADMISSION:  04/18/2013  PRIMARY PULMONOLOGIST: Dr. Meredeth Ide.  PRIMARY  : Dr. Gavin Potters.  REFERRING EMERGENCY ROOM PHYSICIAN: Sharyn Creamer, MD  CHIEF COMPLAINT: Severe cough and syncopal episode.   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 He also has hypertension, depression, work-related exposure to fumes and dust and has COPD secondary to that.   From the last 4 to 5 days, he started having cough which was dry and also had sore throat. Also had some sinus headaches. He had amoxicillin from his previous  so he started taking that, took it for the last 4 days but was not getting better and also was having fever up to 102 degrees Fahrenheit. Was taking Tylenol for that on as-needed basis.   As symptoms were not getting better but getting worse, was not able to sleep properly at night because of severe coughing, so called Dr. Reita Cliche office yesterday but could not see him because he is out of town, so today morning decided to go to [facility] urgent care walk-in center. They were over there in the waiting room but had a severe a coughing spell and he had a syncopal episode following that over there, so sent to Emergency Room for further management.   In the ER, he was found having severe repeated coughing and was given respiratory treatments  with bronchodilators and cough suppressants but did not help much and continued coughing frequently, a lot, and so was given for admission to hospitalist team.   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 Denies any exposure to molds.  REVIEW OF SYSTEMS:  CONSTITUTIONAL:  Positive for fever. Negative for fatigue, generalized weakness, weight loss or weight gain.  EYES: No blurring, double vision, discharge or redness.  ENT:  No tinnitus, ear pain or hearing loss.  RESPIRATORY: Severe coughing. No sputum. Somewhat shortness of breath.  CARDIOVASCULAR: No chest pain, orthopnea, edema, arrhythmia or palpitation but had a syncopal episode after severe coughing spell today.  GASTROINTESTINAL: No nausea, vomiting, diarrhea, abdominal pain.  GENITOURINARY: No dysuria, hematuria, or increased frequency.  ENDOCRINE: No heat or cold intolerance. No increased sweating.  SKIN: No rashes or acne.  MUSCULOSKELETAL: No pain or swelling.  NEUROLOGICAL: No numbness, weakness, tremor, or vertigo.  PSYCHIATRIC: No anxiety, insomnia, bipolar disorder.   PAST MEDICAL HISTORY:  1.  Hypertension.  2.  Depression.  3.  COPD due to work-related exposure to fumes and dust, but not on any regular day-to-day inhalers.  4.  Finding of lung nodule which was worked up by Dr. Meredeth Ide almost 3 years ago by all the workup and found to be noncancerous at that time.  5.  Reiter syndrome, on TNF alpha inhibitor injection once every week.   PAST SURGICAL HISTORY: Hernia repair surgery 3 times.   ALLERGIES: No known drug allergies.   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 He is exposed to fumes and dust all the time. Denies any smoking or alcohol or illegal drug use.   FAMILY HISTORY: Father had Alzheimer's, coronary artery disease, and pacemaker  placement. Mother has breast cancer.   PHYSICAL EXAMINATION:  VITAL SIGNS: In ER, temperature 98, pulse rate 93, respirations ranging from 18 to 20, blood pressure 155/97, pulse oximetry 93% on room air which came up to 95% to 98% on oxygen supplementation.  GENERAL: Currently somewhat drowsy as was not able to sleep properly. Currently his cough is under control and finally is feeling a little bit drowsy.  He is not in any acute distress.  HEENT: Head and neck atraumatic. Conjunctivae pink. Oral mucosa moist.  NECK: Supple. No JVD.  RESPIRATORY: Bilateral equal air entry. Few crepitations.  CARDIOVASCULAR: S1, S2 present, regular. No murmur.  ABDOMEN: Soft, nontender. Bowel sounds present. No organomegaly.  SKIN: No rashes. Legs: No edema.  NEUROLOGICAL: Power five out of five. Follows commands. Moves all four limbs. No gross abnormality.  PSYCHIATRIC: Does not appear in any acute psychiatric illness at this time.   IMPORTANT LABORATORY RESULTS: Glucose 87, BUN 14, creatinine 1.08, sodium 136, potassium 4.1, chloride 103, CO2 at 29, calcium level 8.3, total protein 7.6, bilirubin 0.4, alkaline phosphate 90, SGOT 39, SGPT 41. Troponin is less than 0.02. WBC 5.1, hemoglobin 16.2. Platelet count is 170.   Influenza A and B are negative.   On venous blood gas, pH is 7.43, pCO2 36 and lactic acid is 1.3.   Chest x-ray, one view, portable, showed rounded density in infrahilar region on lateral film not seen on the PA, likely confluence of densities but nodule also possible.   ASSESSMENT AND PLAN: A 49 year old male with a past medical history of Reiter syndrome, hypertension, depression, and work-related exposure to dust and fumes and having chronic obstructive pulmonary disease, having some cough for the last 4 to 5 days. Tried amoxicillin, did not get help and was severely coughing and had a syncopal spell after that today.  1.  Acute bronchitis with chronic obstructive pulmonary disease exacerbation. We will admit him with IV steroid and nebulizer treatment and will give him Rocephin and azithromycin to cover his bronchitis. He was taking amoxicillin but he is immunosuppressed and will have to give higher antibiotics. We will also try to get a sputum though he does not have any. That is what he is telling me. Will also get CT scan with contrast for further evaluation.  2.  Lung nodule. The patient's  wife says that he was diagnosed with that 3 years ago and Dr. Meredeth Ide did the workup for that. As it is a long time, 3 years, we would like to do a repeat CAT scan today and to see for any further findings.  3.  Hypertension. We will currently continue his home medication.  4.  Depression. We will continue his home medication.  5.  Reiter syndrome and on immunosuppressive medication. He takes it every week. Would currently like to hold it because of his acute illness.   CODE STATUS: FULL CODE.   TOTAL TIME SPENT ON THIS ADMISSION: 50 minutes.   ____________________________ Hope Pigeon Elisabeth Pigeon, MD vgv:np D: 04/18/2013 17:52:51 ET T: 04/18/2013 19:03:30 ET JOB#: [id number]  cc: Hope Pigeon. Elisabeth Pigeon, MD, <Dictator> Herbon E. Meredeth Ide, MD  Heath Gold [facility] MD ELECTRONICALLY SIGNED 05/06/2013 22:41

## 2014-06-13 NOTE — H&P (Signed)
PATIENT NAME:  Fernando Goodwin, Fernando Goodwin MR#:  [id number] DATE OF BIRTH:  [birth year]  DATE OF ADMISSION:  04/18/2013  ADDENDUM   HOME MEDICATIONS: 1.  Aspirin 81 mg daily.  2.  Naproxen 500 mg delayed-release 2 times a day with meals.  3.  Citalopram 20 mg tablet, take 1/2 tablet once a day.  4.  Zyrtec 10 mg oral tablet once a day.  5.  Hydrochlorothiazide 25 mg oral tablet once a day.  6.  Enbrel 50 mg/mL subcutaneous solution once a week on Friday or Saturday.  7.  Amoxicillin 500 mg oral capsule 3 times a day for seven days.  8.  Pantoprazole 40 mg delayed-release once a day.    ____________________________ [name]. Fernando Pigeon, MD vgv:ea D: 04/18/2013 18:30:14 ET T: 04/18/2013 19:20:20 ET JOB#: [id number]  cc: [name]. Fernando Pigeon, MD, <Dictator> Altamese Dilling MD ELECTRONICALLY SIGNED 05/06/2013 22:41

## 2014-06-14 NOTE — Op Note (Signed)
PATIENT NAME:  Fernando Goodwin, Fernando Goodwin MR#:  [id number] DATE OF BIRTH:  1965-04-14  DATE OF PROCEDURE:  02/15/2011  PREOPERATIVE DIAGNOSIS: Left inguinal hernia, umbilical hernia.   POSTOPERATIVE DIAGNOSIS: Left inguinal hernia, umbilical hernia.   PROCEDURE: Left inguinal hernia repair, umbilical hernia repair.   SURGEON: Renda Rolls, MD  ANESTHESIA: General.   INDICATION: This 49 year old male had a chief complaint of a bulge in the left groin that has recently been increasing in size with some associated pain with coughing, also had noted enlargement of a bulge in the upper aspect of the umbilicus for which he requested repair as well. Hernias were demonstrated on physical examination and repair is recommended for definitive treatment.   DESCRIPTION OF PROCEDURE: The patient was placed on the operating table in the supine position under general anesthesia. The abdomen was prepared with ChloraPrep and draped in a sterile manner.   A left lower quadrant transversely oriented suprapubic incision was made and carried down through subcutaneous tissues. One traversing vein was divided between 4-0 chromic ligatures. Scarpa's fascia was incised. Several small bleeding points were cauterized. The external oblique aponeurosis was incised along the course of its fibers to open the external ring and expose the inguinal cord structures. The cord structures were mobilized to demonstrated a direct inguinal hernia. The hernia was dissected and then the attenuated transversalis fascia was incised circumferentially and removed. The remainder of the sac was inverted. The defect was approximately 2.5 cm in length and was repaired with a row of 0 Surgilon sutures beginning at the pubic tubercle suturing the conjoined tendon to the shelving edge of the inguinal ligament incorporating transversalis fascia into the repair and this row of suture was carried up to the internal ring. It was also noted that there was a small cord  lipoma within the cremaster which was dissected free from surrounding structures up into the internal ring and ligated with 4-0 Vicryl and amputated. There was no indirect component of the hernia. Next, Atrium mesh was selected and cut to create an oval shape of some 2.5 x 4 cm in length with a notch cut out at one for the cord structures and this was placed along the floor of the inguinal canal sutured to the repair line with 0 Surgilon and also sutured medially to the fascia. Next, the cord structures were replaced along the floor of the inguinal canal. The cut edges of the external oblique aponeurosis were closed with a running 4-0 Vicryl to recreate the external ring. The deep fascia superior and lateral to the repair site was infiltrated with 0.5% Sensorcaine with epinephrine. Subcutaneous tissues were infiltrated as well. The Scarpa's fascia was closed with interrupted 4-0 Vicryl simple sutures. The skin was closed with a running 4-0 Monocryl subcuticular suture.   Attention was turned to the umbilical hernia repair and a supraumbilical curvilinear transversely oriented incision was made some 3 cm in length and carried down through subcutaneous tissues to encounter an umbilical hernia sac. This sac was dissected free from surrounding structures up into the ring defect and then was inverted. The sac itself was some 2 cm and was completely inverted. The fascial ring defect had a diameter of approximately 6 mm and was closed with a single 0 Surgilon figure-of-eight suture. Hemostasis was intact. The wound was closed with a running 4-0 Monocryl suture in the subcuticular layer of the skin.   Both wounds were then dressed with Dermabond. The patient tolerated surgery satisfactorily and is now being prepared for  transfer to the Recovery Room.  ____________________________ Shela Commons. Renda Rolls, MD jws:slb D: 02/15/2011 14:09:37 ET T: 02/15/2011 15:52:56 ET JOB#: [id number]  cc: Adella Hare, MD,  <Dictator> Adella Hare MD ELECTRONICALLY SIGNED 03/05/2011 9:08

## 2015-02-27 IMAGING — CT CT HEAD WITHOUT CONTRAST
1 series · 16 of 30 positions shown, 20 images · non-contrast
Comparison: Head CT 11/28/2005.  A

CLINICAL DATA: Headache for 2 weeks with generalized weakness and
leukocytosis. No acute injury. Initial encounter.

EXAM:
CT HEAD WITHOUT CONTRAST
TECHNIQUE: Contiguous axial images were obtained from the base of the skull
through the vertex without intravenous contrast.

[Series 2: head wo · axial · 0.46mm/px · z∈[-150,-24]mm · 16 of 32 slices shown, 20 images]
[im 2/32  brain]
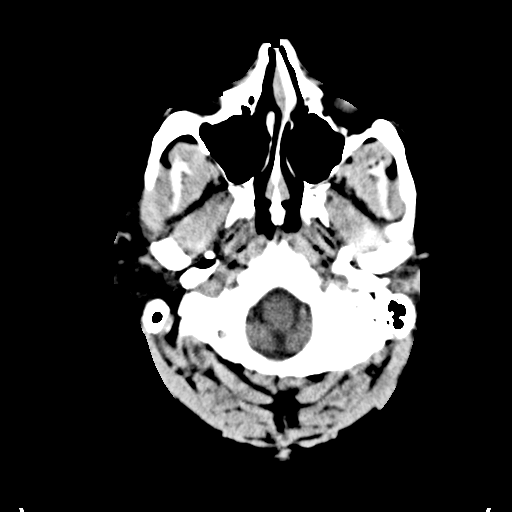
[im 2/32  bone]
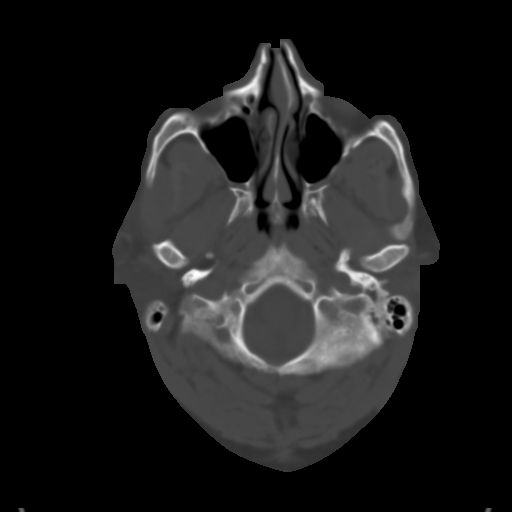
[im 4/32  brain]
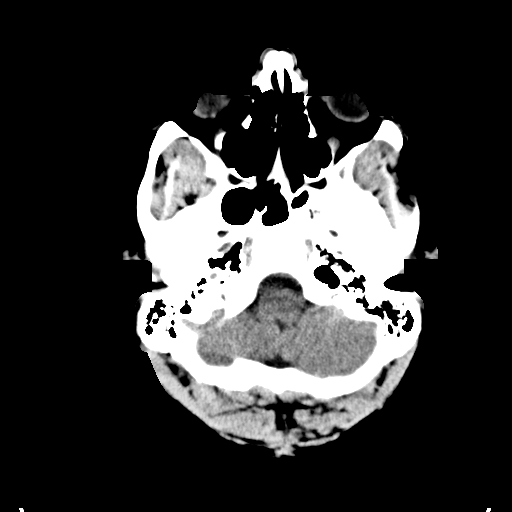
[im 6/32  brain]
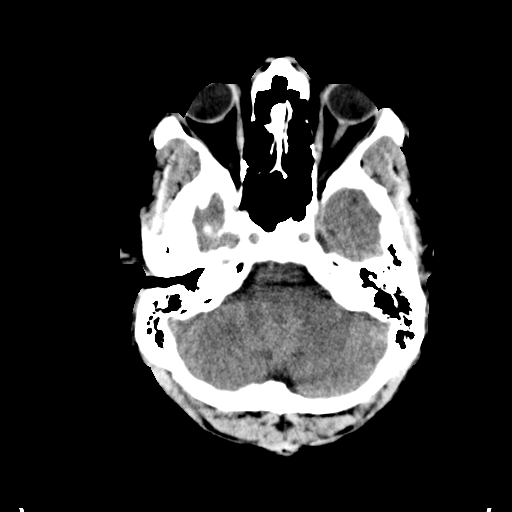
[im 8/32  brain]
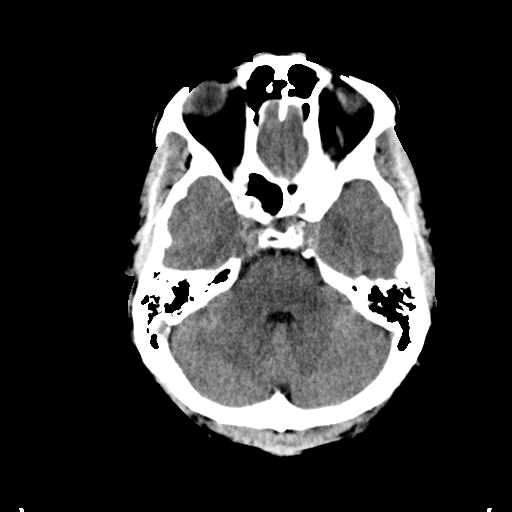
[im 9/32  brain]
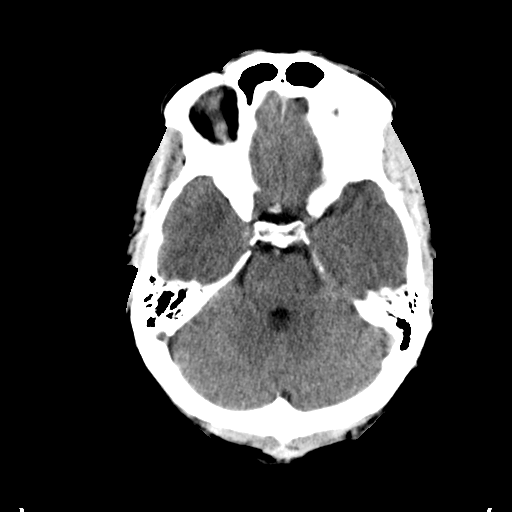
[im 9/32  bone]
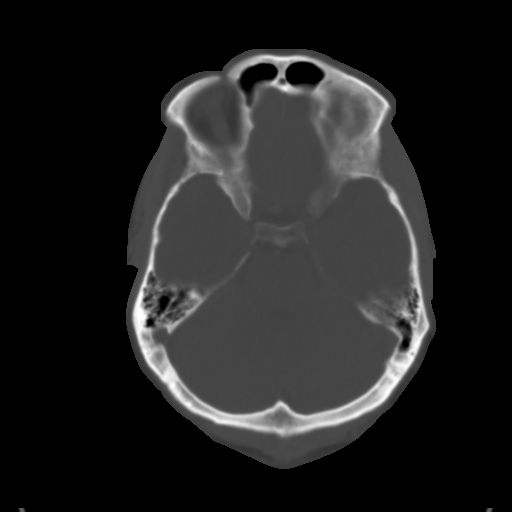
[im 11/32  brain]
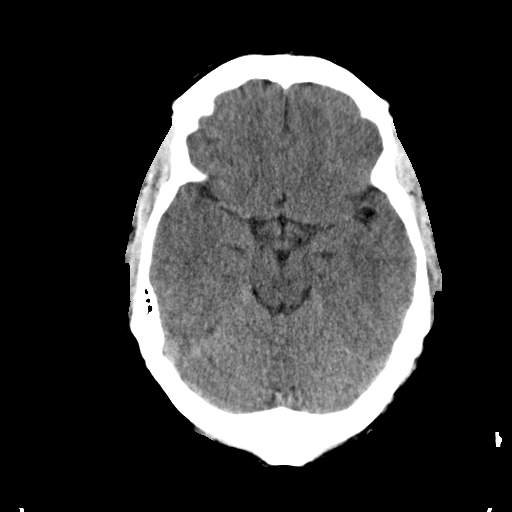
[im 13/32  brain]
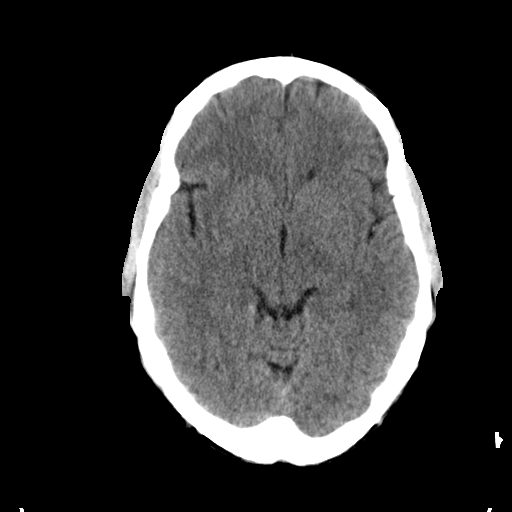
[im 15/32  brain]
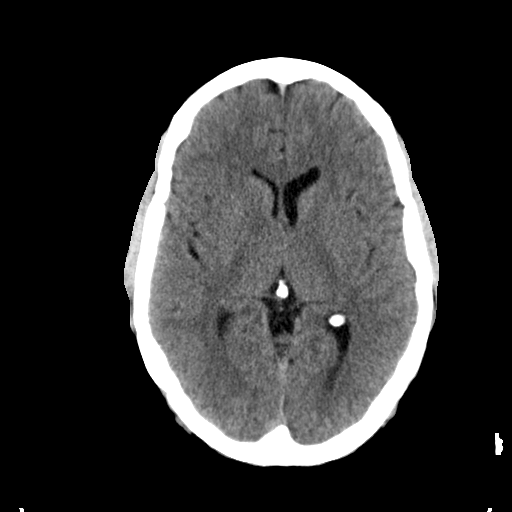
[im 17/32  brain]
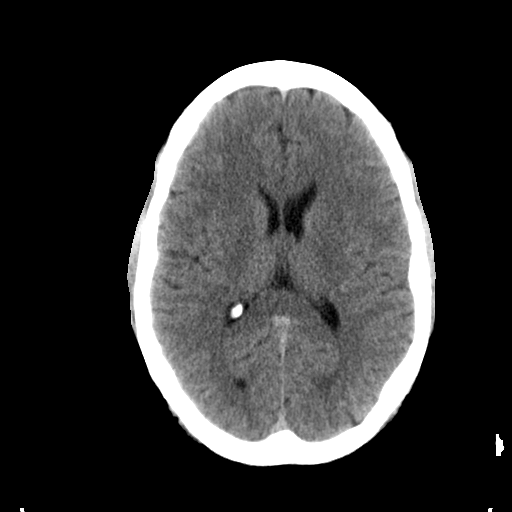
[im 17/32  bone]
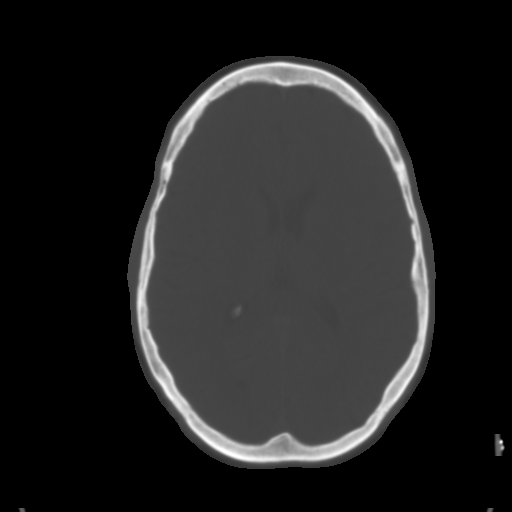
[im 19/32  brain]
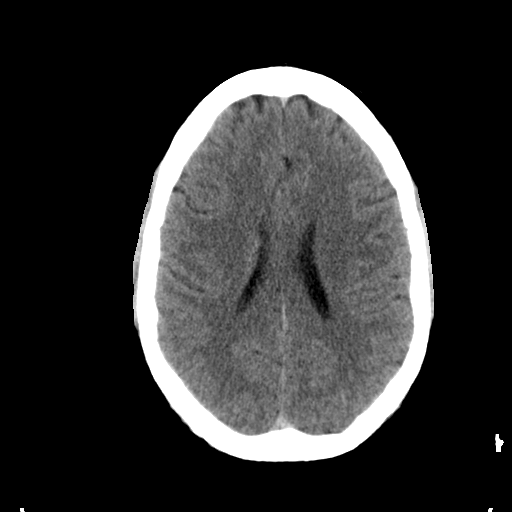
[im 21/32  brain]
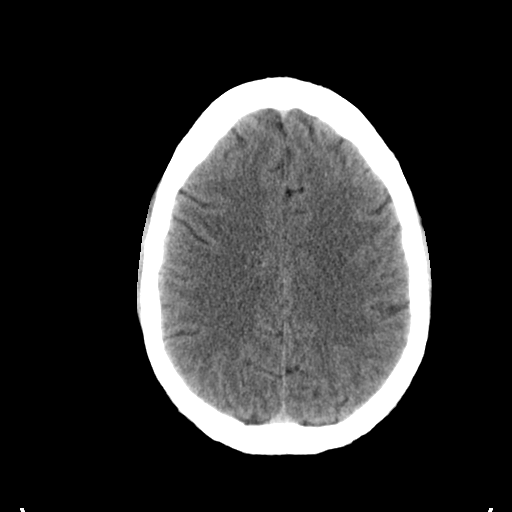
[im 23/32  brain]
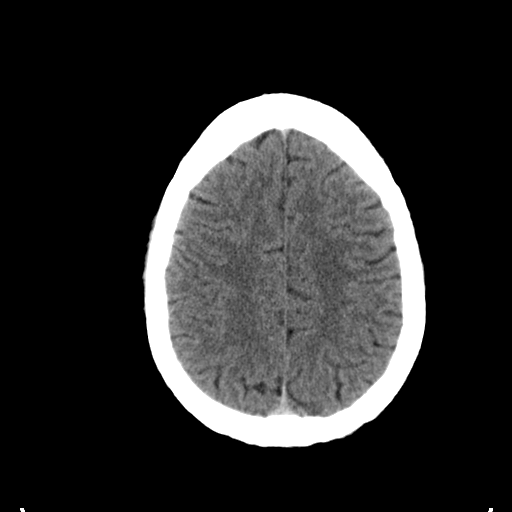
[im 24/32  brain]
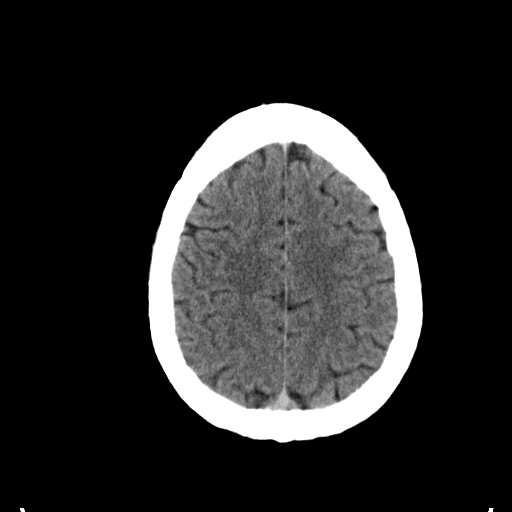
[im 24/32  bone]
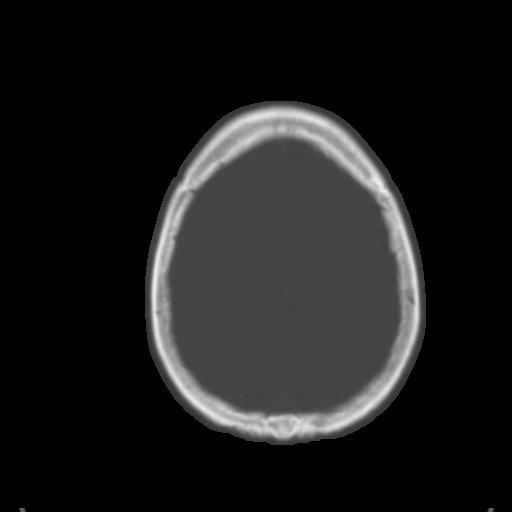
[im 26/32  brain]
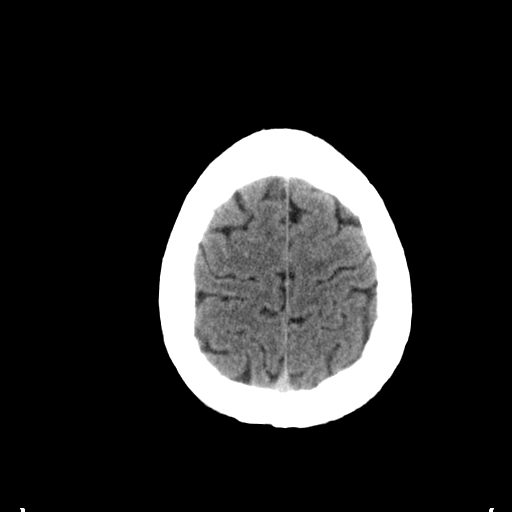
[im 28/32  brain]
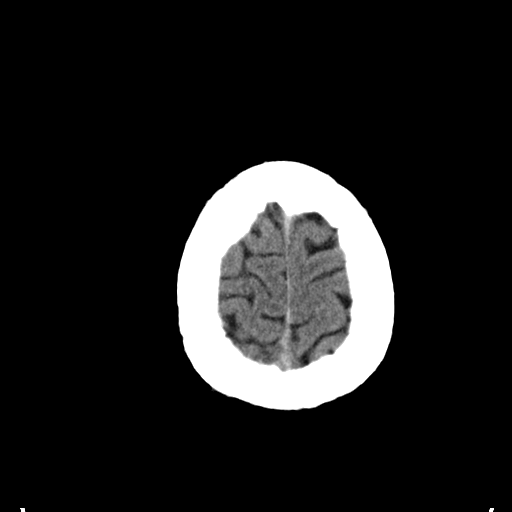
[im 30/32  brain]
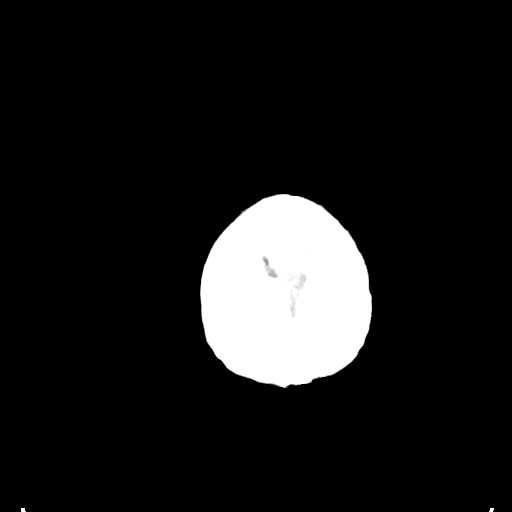

[16 of 30 positions shown; findings below may reference images not displayed]

FINDINGS: There is no evidence of acute intracranial hemorrhage, mass lesion,
brain edema or extra-axial fluid collection. The ventricles and
subarachnoid spaces are appropriately sized for age. There is no CT
evidence of acute cortical infarction.

The visualized paranasal sinuses, mastoid air cells and middle ears
are clear. The calvarium is intact.
IMPRESSION: Stable normal noncontrast head CT.

## 2015-08-05 ENCOUNTER — Encounter: Payer: Self-pay | Admitting: *Deleted

## 2015-08-05 ENCOUNTER — Observation Stay
Admission: EM | Admit: 2015-08-05 | Discharge: 2015-08-06 | Disposition: A | Discharge status: Home / Self Care | Payer: Managed Care, Other (non HMO) | Attending: Internal Medicine | Admitting: Internal Medicine

## 2015-08-05 ENCOUNTER — Emergency Department: Payer: Managed Care, Other (non HMO)

## 2015-08-05 DIAGNOSIS — N281 Cyst of kidney, acquired: Secondary | ICD-10-CM | POA: Insufficient documentation

## 2015-08-05 DIAGNOSIS — R42 Dizziness and giddiness: Secondary | ICD-10-CM

## 2015-08-05 DIAGNOSIS — R0789 Other chest pain: Secondary | ICD-10-CM | POA: Insufficient documentation

## 2015-08-05 DIAGNOSIS — R079 Chest pain, unspecified: Secondary | ICD-10-CM | POA: Diagnosis present

## 2015-08-05 DIAGNOSIS — E785 Hyperlipidemia, unspecified: Secondary | ICD-10-CM | POA: Insufficient documentation

## 2015-08-05 DIAGNOSIS — R531 Weakness: Secondary | ICD-10-CM | POA: Diagnosis not present

## 2015-08-05 DIAGNOSIS — M0230 Reiter's disease, unspecified site: Secondary | ICD-10-CM | POA: Diagnosis not present

## 2015-08-05 DIAGNOSIS — Z7902 Long term (current) use of antithrombotics/antiplatelets: Secondary | ICD-10-CM | POA: Diagnosis not present

## 2015-08-05 DIAGNOSIS — G459 Transient cerebral ischemic attack, unspecified: Secondary | ICD-10-CM

## 2015-08-05 DIAGNOSIS — Z79899 Other long term (current) drug therapy: Secondary | ICD-10-CM | POA: Diagnosis not present

## 2015-08-05 DIAGNOSIS — Z8673 Personal history of transient ischemic attack (TIA), and cerebral infarction without residual deficits: Secondary | ICD-10-CM | POA: Insufficient documentation

## 2015-08-05 DIAGNOSIS — Z87891 Personal history of nicotine dependence: Secondary | ICD-10-CM | POA: Diagnosis not present

## 2015-08-05 DIAGNOSIS — I10 Essential (primary) hypertension: Principal | ICD-10-CM | POA: Insufficient documentation

## 2015-08-05 DIAGNOSIS — K5730 Diverticulosis of large intestine without perforation or abscess without bleeding: Secondary | ICD-10-CM | POA: Insufficient documentation

## 2015-08-05 DIAGNOSIS — J45909 Unspecified asthma, uncomplicated: Secondary | ICD-10-CM | POA: Diagnosis not present

## 2015-08-05 HISTORY — DX: Cerebral infarction, unspecified: I63.9

## 2015-08-05 HISTORY — DX: Unspecified asthma, uncomplicated: J45.909

## 2015-08-05 LAB — COMPREHENSIVE METABOLIC PANEL
ALT: 37 U/L (ref 17–63)
ANION GAP: 8 (ref 5–15)
AST: 31 U/L (ref 15–41)
Albumin: 4.4 g/dL (ref 3.5–5.0)
Alkaline Phosphatase: 63 U/L (ref 38–126)
BUN: 11 mg/dL (ref 6–20)
CHLORIDE: 103 mmol/L (ref 101–111)
CO2: 25 mmol/L (ref 22–32)
Calcium: 8.8 mg/dL — ABNORMAL LOW (ref 8.9–10.3)
Creatinine, Ser: 1 mg/dL (ref 0.61–1.24)
Glucose, Bld: 94 mg/dL (ref 65–99)
POTASSIUM: 3.8 mmol/L (ref 3.5–5.1)
Sodium: 136 mmol/L (ref 135–145)
Total Bilirubin: 0.6 mg/dL (ref 0.3–1.2)
Total Protein: 7.1 g/dL (ref 6.5–8.1)

## 2015-08-05 LAB — DIFFERENTIAL
BASOS ABS: 0.1 10*3/uL (ref 0–0.1)
BASOS PCT: 1 %
EOS ABS: 0.1 10*3/uL (ref 0–0.7)
EOS PCT: 2 %
Lymphocytes Relative: 25 %
Lymphs Abs: 2.3 10*3/uL (ref 1.0–3.6)
MONO ABS: 0.9 10*3/uL (ref 0.2–1.0)
MONOS PCT: 10 %
Neutro Abs: 5.8 10*3/uL (ref 1.4–6.5)
Neutrophils Relative %: 62 %

## 2015-08-05 LAB — GLUCOSE, CAPILLARY: GLUCOSE-CAPILLARY: 94 mg/dL (ref 65–99)

## 2015-08-05 LAB — CBC
HEMATOCRIT: 44 % (ref 40.0–52.0)
Hemoglobin: 15.2 g/dL (ref 13.0–18.0)
MCH: 29.9 pg (ref 26.0–34.0)
MCHC: 34.5 g/dL (ref 32.0–36.0)
MCV: 86.8 fL (ref 80.0–100.0)
Platelets: 227 10*3/uL (ref 150–440)
RBC: 5.08 MIL/uL (ref 4.40–5.90)
RDW: 12.8 % (ref 11.5–14.5)
WBC: 9.2 10*3/uL (ref 3.8–10.6)

## 2015-08-05 LAB — PROTIME-INR
INR: 0.9
Prothrombin Time: 12.4 seconds (ref 11.4–15.0)

## 2015-08-05 LAB — TROPONIN I

## 2015-08-05 LAB — APTT: APTT: 27 s (ref 24–36)

## 2015-08-05 MED ORDER — BISACODYL 5 MG PO TBEC: 5.0000 mg | DELAYED_RELEASE_TABLET | Freq: Every day | ORAL | Status: DC | PRN

## 2015-08-05 MED ORDER — IOPAMIDOL (ISOVUE-370) INJECTION 76%
125.0000 mL | Freq: Once | INTRAVENOUS | Status: AC | PRN
  Administered 2015-08-05: 125 mL via INTRAVENOUS

## 2015-08-05 MED ORDER — ACETAMINOPHEN 325 MG PO TABS
ORAL_TABLET | ORAL | Status: DC
Start: 2015-08-05 — End: 2015-08-05
  Filled 2015-08-05: qty 2

## 2015-08-05 MED ORDER — ALBUTEROL SULFATE (2.5 MG/3ML) 0.083% IN NEBU
2.5000 mg | INHALATION_SOLUTION | Freq: Four times a day (QID) | RESPIRATORY_TRACT | Status: DC
Start: 2015-08-05 — End: 2015-08-06
  Administered 2015-08-06: 2.5 mg via RESPIRATORY_TRACT
  Filled 2015-08-05: qty 3

## 2015-08-05 MED ORDER — TRIAMCINOLONE ACETONIDE 55 MCG/ACT NA AERO
2.0000 | INHALATION_SPRAY | Freq: Every day | NASAL | Status: DC
  Filled 2015-08-05: qty 21.6

## 2015-08-05 MED ORDER — LOSARTAN POTASSIUM 50 MG PO TABS: 50.0000 mg | ORAL_TABLET | Freq: Every day | ORAL | Status: DC

## 2015-08-05 MED ORDER — FLUTICASONE PROPIONATE 50 MCG/ACT NA SUSP
2.0000 | Freq: Every day | NASAL | Status: DC
  Filled 2015-08-05: qty 16

## 2015-08-05 MED ORDER — ONDANSETRON HCL 4 MG/2ML IJ SOLN
4.0000 mg | Freq: Once | INTRAMUSCULAR | Status: AC
  Administered 2015-08-05: 4 mg via INTRAVENOUS
  Filled 2015-08-05: qty 2

## 2015-08-05 MED ORDER — ALPRAZOLAM 0.25 MG PO TABS: 0.5000 mg | ORAL_TABLET | Freq: Every evening | ORAL | Status: DC | PRN

## 2015-08-05 MED ORDER — CLOPIDOGREL BISULFATE 75 MG PO TABS
75.0000 mg | ORAL_TABLET | Freq: Every day | ORAL | Status: DC
Start: 2015-08-05 — End: 2015-08-06
  Administered 2015-08-06: 75 mg via ORAL
  Filled 2015-08-05: qty 1

## 2015-08-05 MED ORDER — HYDROCHLOROTHIAZIDE 12.5 MG PO CAPS
12.5000 mg | ORAL_CAPSULE | Freq: Every day | ORAL | Status: DC
  Filled 2015-08-05: qty 1

## 2015-08-05 MED ORDER — HEPARIN SODIUM (PORCINE) 5000 UNIT/ML IJ SOLN
5000.0000 [IU] | Freq: Three times a day (TID) | INTRAMUSCULAR | Status: DC
  Administered 2015-08-05 – 2015-08-06 (×2): 5000 [IU] via SUBCUTANEOUS
  Filled 2015-08-05 (×2): qty 1

## 2015-08-05 MED ORDER — ACETAMINOPHEN 650 MG RE SUPP: 650.0000 mg | Freq: Four times a day (QID) | RECTAL | Status: DC | PRN

## 2015-08-05 MED ORDER — ATORVASTATIN CALCIUM 20 MG PO TABS: 20.0000 mg | ORAL_TABLET | Freq: Every day | ORAL | Status: DC

## 2015-08-05 MED ORDER — COQ10 50 G PO POWD: Freq: Every day | ORAL | Status: DC

## 2015-08-05 MED ORDER — HYDROCODONE-ACETAMINOPHEN 5-325 MG PO TABS
1.0000 | ORAL_TABLET | ORAL | Status: DC | PRN
  Administered 2015-08-05 – 2015-08-06 (×3): 1 via ORAL
  Filled 2015-08-05 (×3): qty 1

## 2015-08-05 MED ORDER — NITROGLYCERIN 2 % TD OINT
0.5000 [in_us] | TOPICAL_OINTMENT | Freq: Four times a day (QID) | TRANSDERMAL | Status: DC
  Administered 2015-08-05 – 2015-08-06 (×2): 1 [in_us] via TOPICAL
  Filled 2015-08-05 (×2): qty 1

## 2015-08-05 MED ORDER — FAMOTIDINE 20 MG PO TABS
20.0000 mg | ORAL_TABLET | Freq: Two times a day (BID) | ORAL | Status: DC
  Administered 2015-08-06: 20 mg via ORAL
  Filled 2015-08-05: qty 1

## 2015-08-05 MED ORDER — ONDANSETRON HCL 4 MG/2ML IJ SOLN: 4.0000 mg | Freq: Four times a day (QID) | INTRAMUSCULAR | Status: DC | PRN

## 2015-08-05 MED ORDER — ACETAMINOPHEN 325 MG PO TABS
ORAL_TABLET | ORAL | Status: AC
  Administered 2015-08-05: 650 mg via ORAL
  Filled 2015-08-05: qty 2

## 2015-08-05 MED ORDER — HYDROCHLOROTHIAZIDE 12.5 MG PO CAPS
12.5000 mg | ORAL_CAPSULE | Freq: Every day | ORAL | Status: DC
  Administered 2015-08-06: 12.5 mg via ORAL

## 2015-08-05 MED ORDER — MOMETASONE FURO-FORMOTEROL FUM 200-5 MCG/ACT IN AERO
2.0000 | INHALATION_SPRAY | Freq: Two times a day (BID) | RESPIRATORY_TRACT | Status: DC
  Administered 2015-08-06: 2 via RESPIRATORY_TRACT
  Filled 2015-08-05: qty 8.8

## 2015-08-05 MED ORDER — AMLODIPINE BESYLATE 5 MG PO TABS: 10.0000 mg | ORAL_TABLET | Freq: Every day | ORAL | Status: DC

## 2015-08-05 MED ORDER — ACETAMINOPHEN 325 MG PO TABS
650.0000 mg | ORAL_TABLET | Freq: Once | ORAL | Status: AC
  Administered 2015-08-05: 650 mg via ORAL

## 2015-08-05 MED ORDER — LOSARTAN POTASSIUM-HCTZ 50-12.5 MG PO TABS: 1.0000 | ORAL_TABLET | Freq: Every day | ORAL | Status: DC

## 2015-08-05 MED ORDER — FENTANYL CITRATE (PF) 100 MCG/2ML IJ SOLN
50.0000 ug | Freq: Once | INTRAMUSCULAR | Status: AC
  Administered 2015-08-05: 50 ug via INTRAVENOUS
  Filled 2015-08-05: qty 2

## 2015-08-05 MED ORDER — METOPROLOL TARTRATE 25 MG PO TABS
25.0000 mg | ORAL_TABLET | Freq: Two times a day (BID) | ORAL | Status: DC
Start: 2015-08-05 — End: 2015-08-06
  Administered 2015-08-05: 25 mg via ORAL
  Filled 2015-08-05: qty 1

## 2015-08-05 MED ORDER — ACETAMINOPHEN 325 MG PO TABS: 650.0000 mg | ORAL_TABLET | Freq: Four times a day (QID) | ORAL | Status: DC | PRN

## 2015-08-05 MED ORDER — ONDANSETRON HCL 4 MG PO TABS: 4.0000 mg | ORAL_TABLET | Freq: Four times a day (QID) | ORAL | Status: DC | PRN

## 2015-08-05 NOTE — ED Notes (Signed)
Patient transported to CT 

## 2015-08-05 NOTE — ED Notes (Signed)
Called [phone] at [phone] and [facility] at 1626, after CT was complete

## 2015-08-05 NOTE — ED Notes (Signed)
This RN attempted to call report to the floor, RN not available to take report at this time. This RN was told RN from floor will call back.

## 2015-08-05 NOTE — ED Provider Notes (Signed)
[facility] Emergency Department Provider Note   ____________________________________________  Time seen: Immediately upon ED arrival I have reviewed the triage vital signs and the triage nursing note.  HISTORY  Chief Complaint Hypertension; Headache; and Chest Pain   Historian Patient and spouse  HPI Fernando Goodwin is a 50 y.o. male with a history of prior stroke which affected his left side with left arm weakness, and has a history of hypertension is here for complaint of high blood pressure, lightheadedness, and left arm and left leg weakness. Patient states around 3 PM he is at work and started to feel bad and noticed that his blood pressure was high. He checked it several times and his blood pressures were in the 180s over 100 range. He does takePlavix. No history of coronary artery disease. Today he was also having central chest pain that went over to his left arm. No confusion altered mental status.  Chest discomfort has improved somewhat, left arm weakness is mostly resolved, left leg weakness is mostly resolved as well. Symptoms were moderate. Nothing makes it worse or better.    Past Medical History  Diagnosis Date  . Hypertension   . Arthritis   . Stroke [facility])     2015    Patient Active Problem List   Diagnosis Date Noted  . 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 Essential hypertension 04/01/2014  . Hyperlipidemia 04/01/2014    Past Surgical History  Procedure Laterality Date  . Hernia repair      Current Outpatient Rx  Name  Route  Sig  Dispense  Refill  . acetaminophen (TYLENOL) 325 MG tablet   Oral   Take 2 tablets (650 mg total) by mouth every 6 (six) hours as needed for mild pain.         Marland Kitchen ALPRAZolam (XANAX) 0.5 MG tablet   Oral   Take 0.5 mg by mouth at bedtime as needed for anxiety.         Marland Kitchen amLODipine (NORVASC) 10 MG tablet   Oral   Take 10 mg by mouth daily.          Marland Kitchen atorvastatin (LIPITOR) 20 MG tablet   Oral   Take 1 tablet (20 mg total) by mouth daily at 6 PM.   30 tablet   2   . Cetirizine HCl 10 MG CAPS   Oral   Take 10 mg by mouth daily.         . clopidogrel (PLAVIX) 75 MG tablet   Oral   Take 1 tablet (75 mg total) by mouth daily.   30 tablet   3   . Coenzyme Q10 (COQ10 PO)   Oral   Take 1 capsule by mouth daily.         . famotidine (PEPCID) 20 MG tablet   Oral   Take 20 mg by mouth 2 (two) times daily.         . Fluticasone-Salmeterol (ADVAIR) 250-50 MCG/DOSE AEPB   Inhalation   Inhale 1 puff into the lungs 2 (two) times daily.         Marland Kitchen losartan-hydrochlorothiazide (HYZAAR) 50-12.5 MG tablet   Oral   Take 1 tablet by mouth daily.         . naproxen (NAPROSYN) 500 MG tablet   Oral   Take 500 mg by mouth daily. Taking 1/2 tablet daily      3   . NASACORT ALLERGY 24HR 55 MCG/ACT  AERO nasal inhaler   Inhalation   Inhale 2 sprays into the lungs daily as needed (for congestion).       12   . PROAIR HFA 108 (90 BASE) MCG/ACT inhaler   Inhalation   Inhale 2 puffs into the lungs every 6 (six) hours as needed.      6   . EXPIRED: amLODipine (NORVASC) 10 MG tablet   Oral   Take 10 mg by mouth daily.         Marland Kitchen EXPIRED: hydrochlorothiazide (MICROZIDE) 12.5 MG capsule   Oral   Take 12.5 mg by mouth daily.           Allergies Review of patient's allergies indicates no known allergies.  Family History  Problem Relation Age of Onset  . Cancer Mother     Breast  . Dementia Father   . Hypertension Father     Social History Social History  Substance Use Topics  . Smoking status: Former Games developer  . Smokeless tobacco: Never Used  . Alcohol Use: No    Review of Systems  Constitutional: Negative for fever. Eyes: Negative for visual changes. ENT: Negative for sore throat. Cardiovascular: Positive for chest pain. Respiratory: Negative for shortness of breath. Gastrointestinal: Negative for  abdominal pain, vomiting and diarrhea. Genitourinary: Negative for dysuria. Musculoskeletal: Negative for back pain. Skin: Negative for rash. Neurological: Positive for headache. 10 point Review of Systems otherwise negative ____________________________________________   PHYSICAL EXAM:  VITAL SIGNS: ED Triage Vitals  Enc Vitals Group     BP 08/05/15 1617 156/89 mmHg     Pulse Rate 08/05/15 1617 82     Resp 08/05/15 1617 20     Temp 08/05/15 1617 98.2 F (36.8 C)     Temp Source 08/05/15 1617 Oral     SpO2 08/05/15 1617 97 %     Weight 08/05/15 1617 212 lb (96.163 kg)     Height 08/05/15 1617 5\' 8"  (1.727 m)     Head Cir --      Peak Flow --      Pain Score 08/05/15 1618 7     Pain Loc --      Pain Edu? --      Excl. in GC? --      Constitutional: Alert and oriented. Well appearing and in no distress. HEENT   Head: Normocephalic and atraumatic.      Eyes: Conjunctivae are normal. PERRL. Normal extraocular movements.      Ears:         Nose: No congestion/rhinnorhea.   Mouth/Throat: Mucous membranes are moist.   Neck: No stridor. Cardiovascular/Chest: Normal rate, regular rhythm.  No murmurs, rubs, or gallops. Respiratory: Normal respiratory effort without tachypnea nor retractions. Breath sounds are clear and equal bilaterally. No wheezes/rales/rhonchi. Gastrointestinal: Soft. No distention, no guarding, no rebound. Nontender.   Genitourinary/rectal:Deferred Musculoskeletal: Nontender with normal range of motion in all extremities. No joint effusions.  No lower extremity tenderness.  No edema. Neurologic:  No slurred speech. No facial droop. Cranial nerves II through X intact. No appreciable strength deficit in 4 extremities for my testing. No paresthesias. Skin:  Skin is warm, dry and intact. No rash noted. Psychiatric: Mood and affect are normal. Speech and behavior are normal. Patient exhibits appropriate insight and  judgment.  ____________________________________________   EKG I, 08/07/15, MD, the attending physician have personally viewed and interpreted all ECGs.  81 bpm .normal sinus rhythm. Narrow QRS. Normal axis. Normal ST and T-wave ____________________________________________  LABS (pertinent positives/negatives)  Labs Reviewed  COMPREHENSIVE METABOLIC PANEL - Abnormal; Notable for the following:    Calcium 8.8 (*)    All other components within normal limits  PROTIME-INR  APTT  CBC  DIFFERENTIAL  TROPONIN I  GLUCOSE, CAPILLARY  CBG MONITORING, ED    ____________________________________________  RADIOLOGY All Xrays were viewed by me. Imaging interpreted by Radiologist.  CT head without contrast:  IMPRESSION: Stable and normal noncontrast CT appearance of the brain.  Study discussed by telephone with Dr. Shaune Pollack on 08/05/2015 at 16:49 .  CT angios chest abdomen and pelvis: IMPRESSION: No evidence of pulmonary embolus.  No evidence of acute abnormality within the abdomen.  Benign-appearing left renal cyst.  Left colonic diverticulosis without evidence of diverticulitis.  Heterogeneous appearance of the prostate gland, nonspecific finding. Correlation with serum PSA is recommended. __________________________________________  PROCEDURES  Procedure(s) performed: None  Critical Care performed: CRITICAL CARE Performed by: Governor Rooks   Total critical care time: 30 minutes  Critical care time was exclusive of separately billable procedures and treating other patients.  Critical care was necessary to treat or prevent imminent or life-threatening deterioration.  Critical care was time spent personally by me on the following activities: development of treatment plan with patient and/or surrogate as well as nursing, discussions with consultants, evaluation of patient's response to treatment, examination of patient, obtaining history from patient or surrogate,  ordering and performing treatments and interventions, ordering and review of laboratory studies, ordering and review of radiographic studies, pulse oximetry and re-evaluation of patient's condition.   ____________________________________________   ED COURSE / ASSESSMENT AND PLAN  Pertinent labs & imaging results that were available during my care of the patient were reviewed by me and considered in my medical decision making (see chart for details).   From triage the patient was made a code stroke because complaint of left arm and left leg weakness.  When I spoke with him the onset sounds like it was maybe around 3 PM, and his weakness has essentially resolved although he thinks he might still have a little bit of weakness in his left arm.  On my exam he is not quite as hypertensive as he was reportedly at his work, and he has no focal neurologic deficits since. His complaint is that he may still have some weakness in his left arm, but I cannot appreciated.  I spoke with the neurologist to evaluate the patient via tele-neurology, given the patient's chest pain and arm symptoms, he recommended CT angiogram of the chest, I will go ahead and angios study through the chest abdomen pelvis given both extremity complaints with the chest pain.  Ultimately I think the patient needs to be admitted for TIA and chest pain. He is already on Plavix.  CT chest and abdomen and pelvis without acute vascular emergency or source of his neurologic complaints. He is currently symptomatically and to control from the standpoint of chest discomfort and improved neurologic complaints. I discussed with the hospitalist for admission.   CONSULTATIONS:  Tele-neurology. Hospitalist for admission   Patient / Family / Caregiver informed of clinical course, medical decision-making process, and agree with plan.    ___________________________________________   FINAL CLINICAL IMPRESSION(S) / ED DIAGNOSES   Final  diagnoses:  Transient cerebral ischemia, unspecified transient cerebral ischemia type  Chest pain, unspecified chest pain type              Note: This dictation was prepared with Dragon dictation. Any transcriptional errors that result  from this process are unintentional   Governor Rooks, MD 08/05/15 2005

## 2015-08-05 NOTE — Progress Notes (Signed)
[facility] provided pastoral care & prayer for pt & spouse.   08/05/15 1700  Clinical Encounter Type  Visited With Patient and family together  Visit Type Spiritual support  Referral From Nurse  Spiritual Encounters  Spiritual Needs Prayer  Stress Factors  Patient Stress Factors Health changes  Family Stress Factors Health changes

## 2015-08-05 NOTE — ED Notes (Signed)
Floor called back to receive report from this RN.

## 2015-08-05 NOTE — H&P (Signed)
[facility] [place] at [facility]   PATIENT NAME: Fernando Goodwin    MR#:  [id number]  DATE OF BIRTH:  Feb 22, 1965  DATE OF ADMISSION:  08/05/2015  PRIMARY CARE PHYSICIAN: Barbette Reichmann, MD   REQUESTING/REFERRING PHYSICIAN: Dr. Governor Rooks  CHIEF COMPLAINT: Chest pain and elevated blood pressure    Chief Complaint  Patient presents with  . Hypertension  . Headache  . Chest Pain    HISTORY OF PRESENT ILLNESS:  Fernando Goodwin  is a 50 y.o. male with a known history of Essential hypertension, previous stroke with left-sided weakness was having left-sided chest pain at workload this afternoon and he also was feeling sweaty and chest pain was radiating down the left arm and he also had headache. At that time he checked the blood pressure and it was 180/110. Concerning the chest pain and elevated blood pressure very came to the emergency room. In the emergency room in Triage, complaint of left-sided weakness. Code stroke was called. Initial head CAT scan normal. Admitted with admitting the patient for chest pain, possible TIA. Patient symptoms mainly concerning for chest pain chest tightness elevated blood pressure left-sided Arm pain and sweating. He had history of stroke and left-sided weakness slightly. No double vision no weakness on the right side. Patient missed taking that medication yesterday. He was treated with prednisone, Levaquin recently with primary doctor for walking pneumonia, reactive airway disease. Patient finished both of them yesterday.  PAST MEDICAL HISTORY:   Past Medical History  Diagnosis Date  . Hypertension   . Arthritis   . Stroke (HCC)     2015  . Asthma     PAST SURGICAL HISTOIRY:   Past Surgical History  Procedure Laterality Date  . Hernia repair      SOCIAL HISTORY:   Social History  Substance Use Topics  . Smoking status: Former Games developer  . Smokeless tobacco: Never Used  . Alcohol Use: No    FAMILY HISTORY:    Family History  Problem Relation Age of Onset  . Cancer Mother     Breast  . Dementia Father   . Hypertension Father     DRUG ALLERGIES:  No Known Allergies  REVIEW OF SYSTEMS:  CONSTITUTIONAL: No fever, fatigue or weakness.  EYES: No blurred or double vision.  EARS, NOSE, AND THROAT: No tinnitus or ear pain.  RESPIRATORY: No cough, shortness of breath, wheezing or hemoptysis.  CARDIOVASCULAR: Chest pressure, left-sided the arm pain GASTROINTESTINAL: No nausea, vomiting, diarrhea or abdominal pain.  GENITOURINARY: No dysuria, hematuria.  ENDOCRINE: No polyuria, nocturia,  HEMATOLOGY: No anemia, easy bruising or bleeding SKIN: No rash or lesion. MUSCULOSKELETAL: No joint pain or arthritis.   NEUROLOGIC: No tingling, numbness, weakness.  PSYCHIATRY: No anxiety or depression.   MEDICATIONS AT HOME:   Prior to Admission medications   Medication Sig Start Date End Date Taking? Authorizing Provider  acetaminophen (TYLENOL) 325 MG tablet Take 2 tablets (650 mg total) by mouth every 6 (six) hours as needed for mild pain. 01/25/14  Yes David L Rinehuls, PA-C  ALPRAZolam (XANAX) 0.5 MG tablet Take 0.5 mg by mouth at bedtime as needed for anxiety.   Yes Historical Provider, MD  amLODipine (NORVASC) 10 MG tablet Take 10 mg by mouth daily.   Yes Historical Provider, MD  atorvastatin (LIPITOR) 20 MG tablet Take 1 tablet (20 mg total) by mouth daily at 6 PM. 01/25/14  Yes David L Rinehuls, PA-C  Cetirizine HCl 10 MG CAPS Take 10 mg  by mouth daily.   Yes Historical Provider, MD  clopidogrel (PLAVIX) 75 MG tablet Take 1 tablet (75 mg total) by mouth daily. 01/25/14  Yes David L Rinehuls, PA-C  Coenzyme Q10 (COQ10 PO) Take 1 capsule by mouth daily.   Yes Historical Provider, MD  famotidine (PEPCID) 20 MG tablet Take 20 mg by mouth 2 (two) times daily.   Yes Historical Provider, MD  Fluticasone-Salmeterol (ADVAIR) 250-50 MCG/DOSE AEPB Inhale 1 puff into the lungs 2 (two) times daily.   Yes  Historical Provider, MD  losartan-hydrochlorothiazide (HYZAAR) 50-12.5 MG tablet Take 1 tablet by mouth daily.   Yes Historical Provider, MD  naproxen (NAPROSYN) 500 MG tablet Take 500 mg by mouth daily. Taking 1/2 tablet daily 11/29/13  Yes Historical Provider, MD  NASACORT ALLERGY 24HR 55 MCG/ACT AERO nasal inhaler Inhale 2 sprays into the lungs daily as needed (for congestion).  01/12/14  Yes Historical Provider, MD  PROAIR HFA 108 (90 BASE) MCG/ACT inhaler Inhale 2 puffs into the lungs every 6 (six) hours as needed. 12/26/13  Yes Historical Provider, MD  amLODipine (NORVASC) 10 MG tablet Take 10 mg by mouth daily. 02/09/14 02/09/15  Historical Provider, MD  hydrochlorothiazide (MICROZIDE) 12.5 MG capsule Take 12.5 mg by mouth daily. 02/18/14 02/18/15  Historical Provider, MD      VITAL SIGNS:  Blood pressure 122/83, pulse 84, temperature 98.2 F (36.8 C), temperature source Oral, resp. rate 25, height 5\' 8"  (1.727 m), weight 96.163 kg (212 lb), SpO2 96 %.  PHYSICAL EXAMINATION:  GENERAL:  50 y.o.-year-old patient lying in the bed with no acute distress.  EYES: Pupils equal, round, reactive to light and accommodation. No scleral icterus. Extraocular muscles intact.  HEENT: Head atraumatic, normocephalic. Oropharynx and nasopharynx clear.  NECK:  Supple, no jugular venous distention. No thyroid enlargement, no tenderness.  LUNGS: Normal breath sounds bilaterally, no wheezing, rales,rhonchi or crepitation. No use of accessory muscles of respiration.  CARDIOVASCULAR: S1, S2 normal. No murmurs, rubs, or gallops.  ABDOMEN: Soft, nontender, nondistended. Bowel sounds present. No organomegaly or mass.  EXTREMITIES: No pedal edema, cyanosis, or clubbing.  NEUROLOGIC: Cranial nerves II through XII are intact. Muscle strength 5/5 in all extremities. Sensation intact. Gait not checked.  PSYCHIATRIC: The patient is alert and oriented x 3.  SKIN: No obvious rash, lesion, or ulcer.   LABORATORY PANEL:    CBC  Recent Labs Lab 08/05/15 1612  WBC 9.2  HGB 15.2  HCT 44.0  PLT 227   ------------------------------------------------------------------------------------------------------------------  Chemistries   Recent Labs Lab 08/05/15 1612  NA 136  K 3.8  CL 103  CO2 25  GLUCOSE 94  BUN 11  CREATININE 1.00  CALCIUM 8.8*  AST 31  ALT 37  ALKPHOS 63  BILITOT 0.6   ------------------------------------------------------------------------------------------------------------------  Cardiac Enzymes  Recent Labs Lab 08/05/15 1612  TROPONINI <0.03   ------------------------------------------------------------------------------------------------------------------  RADIOLOGY:  Ct Head Wo Contrast  08/05/2015  CLINICAL DATA:  50 year old male with headache chest pain and nausea today. Code stroke. Initial encounter. EXAM: CT HEAD WITHOUT CONTRAST TECHNIQUE: Contiguous axial images were obtained from the base of the skull through the vertex without intravenous contrast. COMPARISON:  Brain MRI 01/23/2014.  Head CT 01/23/2014. FINDINGS: Stable and well pneumatized visualized paranasal sinuses and mastoids. No acute osseous abnormality identified. Visualized orbits and scalp soft tissues are within normal limits. Cerebral volume is stable and normal. No midline shift, ventriculomegaly, mass effect, evidence of mass lesion, intracranial hemorrhage or evidence of cortically based acute infarction. Gray-white  matter differentiation is within normal limits throughout the brain. No suspicious intracranial vascular hyperdensity. IMPRESSION: Stable and normal noncontrast CT appearance of the brain. Study discussed by telephone with Dr. Shaune Pollack on 08/05/2015 at 16:49 . Electronically Signed   By: Odessa Fleming M.D.   On: 08/05/2015 16:50   Ct Angio Chest Aorta W/cm &/or Wo/cm  08/05/2015  CLINICAL DATA:  Chest pain and headache.  Intermittent nausea. EXAM: CT ANGIOGRAPHY CHEST CT ABDOMEN AND PELVIS WITH  AND WITHOUT CONTRAST TECHNIQUE: Multidetector CT imaging of the chest was performed using the standard protocol during bolus administration of intravenous contrast. Multiplanar CT image reconstructions and MIPs were obtained to evaluate the vascular anatomy. Multidetector CT imaging of the abdomen and pelvis was performed using the standard protocol during bolus administration of intravenous contrast. CONTRAST:  125 cc of Isovue 370. COMPARISON:  COMPARISON None. FINDINGS: CTA CHEST FINDINGS Mediastinum/Lymph Nodes: No pulmonary emboli or thoracic aortic dissection identified. No masses or pathologically enlarged lymph nodes identified. The heart and great vessels are normal. Lungs/Pleura: No pulmonary mass, infiltrate, or effusion. Musculoskeletal: No chest wall mass or suspicious bone lesions identified. CT ABDOMEN and PELVIS FINDINGS Hepatobiliary: No mass visualized on this un-enhanced exam. Pancreas: No mass or inflammatory process identified on this un-enhanced exam. Spleen: Within normal limits in size. Adrenals/Urinary Tract: No evidence of urolithiasis or hydronephrosis. No definite mass visualized on this un-enhanced exam. Benign-appearing mostly exophytic 5 cm left renal cyst Stomach/Bowel: No evidence of obstruction, inflammatory process, or abnormal fluid collections. Scatter left colonic diverticulosis without evidence of diverticulitis. Vascular/Lymphatic: No pathologically enlarged lymph nodes. No evidence of abdominal aortic aneurysm. Reproductive: Heterogeneous appearance of the prostate gland, which measures 4.0 x 4.3 cm. Other: None. Musculoskeletal: No suspicious bone lesions identified. Review of the MIP images confirms the above findings. IMPRESSION: No evidence of pulmonary embolus. No evidence of acute abnormality within the abdomen. Benign-appearing left renal cyst. Left colonic diverticulosis without evidence of diverticulitis. Heterogeneous appearance of the prostate gland, nonspecific  finding. Correlation with serum PSA is recommended. Electronically Signed   By: Ted Mcalpine M.D.   On: 08/05/2015 19:39   Ct Cta Abd/pel W/cm &/or W/o Cm  08/05/2015  CLINICAL DATA:  Chest pain and headache.  Intermittent nausea. EXAM: CT ANGIOGRAPHY CHEST CT ABDOMEN AND PELVIS WITH AND WITHOUT CONTRAST TECHNIQUE: Multidetector CT imaging of the chest was performed using the standard protocol during bolus administration of intravenous contrast. Multiplanar CT image reconstructions and MIPs were obtained to evaluate the vascular anatomy. Multidetector CT imaging of the abdomen and pelvis was performed using the standard protocol during bolus administration of intravenous contrast. CONTRAST:  125 cc of Isovue 370. COMPARISON:  COMPARISON None. FINDINGS: CTA CHEST FINDINGS Mediastinum/Lymph Nodes: No pulmonary emboli or thoracic aortic dissection identified. No masses or pathologically enlarged lymph nodes identified. The heart and great vessels are normal. Lungs/Pleura: No pulmonary mass, infiltrate, or effusion. Musculoskeletal: No chest wall mass or suspicious bone lesions identified. CT ABDOMEN and PELVIS FINDINGS Hepatobiliary: No mass visualized on this un-enhanced exam. Pancreas: No mass or inflammatory process identified on this un-enhanced exam. Spleen: Within normal limits in size. Adrenals/Urinary Tract: No evidence of urolithiasis or hydronephrosis. No definite mass visualized on this un-enhanced exam. Benign-appearing mostly exophytic 5 cm left renal cyst Stomach/Bowel: No evidence of obstruction, inflammatory process, or abnormal fluid collections. Scatter left colonic diverticulosis without evidence of diverticulitis. Vascular/Lymphatic: No pathologically enlarged lymph nodes. No evidence of abdominal aortic aneurysm. Reproductive: Heterogeneous appearance of the prostate gland, which measures  4.0 x 4.3 cm. Other: None. Musculoskeletal: No suspicious bone lesions identified. Review of the MIP  images confirms the above findings. IMPRESSION: No evidence of pulmonary embolus. No evidence of acute abnormality within the abdomen. Benign-appearing left renal cyst. Left colonic diverticulosis without evidence of diverticulitis. Heterogeneous appearance of the prostate gland, nonspecific finding. Correlation with serum PSA is recommended. Electronically Signed   By: Ted Mcalpine M.D.   On: 08/05/2015 19:39    EKG:   Orders placed or performed during the hospital encounter of 08/05/15  . ED EKG  . EKG 12-Lead  . EKG 12-Lead  . ED EKG  NSR 81 bpm, no ST T changes.  IMPRESSION AND PLAN:   50 yr old Male patient with elevated blood pressure having chest pain, left arm pain concerning for cardiac event rather than acute stroke. Admit the patient to telemetry, cycle the troponins, continue aspirin, beta blockers, statins, nitrates,  check echocardiogram. 2.malignant htn;better now;resume home meds,start low dose metoprolol 3.left side weakness,h/o storke;MRI brain,carotid sono,continue Plavix 4.h/o Reiters  Arthritis better now,follows with Dr.Kern[facility]le D/w wife   All the records are reviewed and case discussed with ED provider. Management plans discussed with the patient, family and they are in agreement.  CODE STATUS: full  TOTAL TIME TAKING CARE OF THIS PATIENT:55 minutes.    Katha Hamming M.D on 08/05/2015 at 8:42 PM  Between 7am to 6pm - Pager - [phone]  After 6pm go to [email or web address] - password [email or web address]  [facility]  Office  [phone]  CC: Primary care physician; Barbette Reichmann, MD  Note: This dictation was prepared with Dragon dictation along with smaller phrase technology. Any transcriptional errors that result from this process are unintentional.

## 2015-08-05 NOTE — Progress Notes (Addendum)
PHARMACIST - PHYSICIAN ORDER COMMUNICATION  CONCERNING: P&T Medication Policy on Herbal Medications  DESCRIPTION:  This patient's order for:  Co Q-10 powder has been noted.  This product(s) is classified as an "herbal" or natural product. Due to a lack of definitive safety studies or FDA approval, nonstandard manufacturing practices, plus the potential risk of unknown drug-drug interactions while on inpatient medications, the Pharmacy and Therapeutics Committee does not permit the use of "herbal" or natural products of this type within [facility].   ACTION TAKEN: The pharmacy department is unable to verify this order at this time and your patient has been informed of this safety policy. Please reevaluate patient's clinical condition at discharge and address if the herbal or natural product(s) should be resumed at that time.

## 2015-08-05 NOTE — ED Notes (Addendum)
Pt to triage via wheelchair.  Pt reports chest pain and htn while sitting in a meeting at work today.  Pt also reports a headache since 1400 today and took 2 tylenol without relief.  Had nausea earlier.  No nausea  now. Pt alert speech clear.   Hx stroke.  Pt taken to ct scan with nurse from triage room.

## 2015-08-06 ENCOUNTER — Observation Stay: Admit: 2015-08-06 | Payer: Managed Care, Other (non HMO)

## 2015-08-06 ENCOUNTER — Observation Stay: Payer: Managed Care, Other (non HMO)

## 2015-08-06 ENCOUNTER — Ambulatory Visit: Payer: Managed Care, Other (non HMO)

## 2015-08-06 ENCOUNTER — Observation Stay (HOSPITAL_BASED_OUTPATIENT_CLINIC_OR_DEPARTMENT_OTHER): Payer: Managed Care, Other (non HMO)

## 2015-08-06 DIAGNOSIS — R0789 Other chest pain: Secondary | ICD-10-CM

## 2015-08-06 DIAGNOSIS — R079 Chest pain, unspecified: Secondary | ICD-10-CM | POA: Diagnosis not present

## 2015-08-06 LAB — CBC
HCT: 42.5 % (ref 40.0–52.0)
Hemoglobin: 14.6 g/dL (ref 13.0–18.0)
MCH: 30.5 pg (ref 26.0–34.0)
MCHC: 34.4 g/dL (ref 32.0–36.0)
MCV: 88.6 fL (ref 80.0–100.0)
PLATELETS: 211 10*3/uL (ref 150–440)
RBC: 4.8 MIL/uL (ref 4.40–5.90)
RDW: 13 % (ref 11.5–14.5)
WBC: 9.4 10*3/uL (ref 3.8–10.6)

## 2015-08-06 LAB — NM MYOCAR MULTI W/SPECT W/WALL MOTION / EF
CHL CUP NUCLEAR SDS: 1
CHL CUP NUCLEAR SSS: 0
CSEPHR: 56 %
CSEPPHR: 96 {beats}/min
LV dias vol: 71 mL (ref 62–150)
LVSYSVOL: 33 mL
Rest HR: 66 {beats}/min
SRS: 0
TID: 1.12

## 2015-08-06 LAB — BASIC METABOLIC PANEL
Anion gap: 9 (ref 5–15)
BUN: 12 mg/dL (ref 6–20)
CALCIUM: 8.1 mg/dL — AB (ref 8.9–10.3)
CO2: 23 mmol/L (ref 22–32)
CREATININE: 0.85 mg/dL (ref 0.61–1.24)
Chloride: 104 mmol/L (ref 101–111)
GFR calc non Af Amer: >60 mL/min (ref >60)
Glucose, Bld: 93 mg/dL (ref 65–99)
Potassium: 3.8 mmol/L (ref 3.5–5.1)
SODIUM: 136 mmol/L (ref 135–145)

## 2015-08-06 LAB — GLUCOSE, CAPILLARY: GLUCOSE-CAPILLARY: 101 mg/dL — AB (ref 65–99)

## 2015-08-06 LAB — TROPONIN I

## 2015-08-06 MED ORDER — TECHNETIUM TC 99M TETROFOSMIN IV KIT
32.7110 | PACK | Freq: Once | INTRAVENOUS | Status: AC | PRN
  Administered 2015-08-06: 32.711 via INTRAVENOUS

## 2015-08-06 MED ORDER — NITROGLYCERIN 2 % TD OINT: 0.5000 [in_us] | TOPICAL_OINTMENT | Freq: Four times a day (QID) | TRANSDERMAL | Status: DC

## 2015-08-06 MED ORDER — ALBUTEROL SULFATE (2.5 MG/3ML) 0.083% IN NEBU: 2.5000 mg | INHALATION_SOLUTION | Freq: Four times a day (QID) | RESPIRATORY_TRACT | Status: DC | PRN

## 2015-08-06 MED ORDER — METOPROLOL TARTRATE 25 MG PO TABS: 25.0000 mg | ORAL_TABLET | Freq: Two times a day (BID) | ORAL | Status: DC

## 2015-08-06 MED ORDER — NITROGLYCERIN 0.4 MG SL SUBL: 0.4000 mg | SUBLINGUAL_TABLET | SUBLINGUAL | Status: AC | PRN

## 2015-08-06 MED ORDER — TECHNETIUM TC 99M TETROFOSMIN IV KIT
13.8400 | PACK | Freq: Once | INTRAVENOUS | Status: AC | PRN
  Administered 2015-08-06: 13.84 via INTRAVENOUS

## 2015-08-06 MED ORDER — REGADENOSON 0.4 MG/5ML IV SOLN
0.4000 mg | Freq: Once | INTRAVENOUS | Status: AC
  Administered 2015-08-06: 0.4 mg via INTRAVENOUS

## 2015-08-06 NOTE — Discharge Instructions (Signed)
Heart healthy diet. °Activity as tolerated. °

## 2015-08-06 NOTE — Progress Notes (Signed)
Patient discharged via wheelchair and private vehicle. IV removed and catheter intact. All discharge instructions given and patient verbalizes understanding. Tele removed and returned. Prescriptions given to patient No distress noted.   

## 2015-08-06 NOTE — Discharge Summary (Addendum)
[facility] [place] at [facility] [place]   PATIENT NAME: Fernando Goodwin    MR#:  [id number]  DATE OF BIRTH:  November 19, 1965  DATE OF ADMISSION:  08/05/2015 ADMITTING PHYSICIAN: Katha Hamming, MD  DATE OF DISCHARGE: 08/06/2015 PRIMARY CARE PHYSICIAN: Barbette Reichmann, MD    ADMISSION DIAGNOSIS:  Dizziness [R42] Transient cerebral ischemia, unspecified transient cerebral ischemia type [G45.9] Chest pain, unspecified chest pain type [R07.9]   DISCHARGE DIAGNOSIS:   Malignant hypertension Chest pain due to malignant hypertension. SECONDARY DIAGNOSIS:   Past Medical History  Diagnosis Date  . Hypertension   . Arthritis   . Stroke (HCC)     2015  . Asthma     HOSPITAL COURSE:   50 yr old Male patient with elevated blood pressure having chest pain, left arm pain concerning for cardiac event rather than acute stroke.  He has been treated with aspirin, beta blockers, statins, nitrates. Stress test is normal, echocardiogram report is pending.  2.malignant htn. He has been treated with home hypertension medication plus Lopressor. Blood pressure is under control.  3.left side weakness,h/o storke, Normal brain MRI. Carotid duplex does not show any stenosis, continue Plavix 4.h/o Reiters Arthritis better now,follows with Dr.Kernodle  DISCHARGE CONDITIONS:   Stable, discharge to home today.  CONSULTS OBTAINED:     DRUG ALLERGIES:  No Known Allergies  DISCHARGE MEDICATIONS:   Current Discharge Medication List    START taking these medications   Details  metoprolol tartrate (LOPRESSOR) 25 MG tablet Take 1 tablet (25 mg total) by mouth 2 (two) times daily. Qty: 60 tablet, Refills: 2    nitroGLYCERIN (NITROSTAT) 0.4 MG SL tablet Place 1 tablet (0.4 mg total) under the tongue every 5 (five) minutes as needed for chest pain. Qty: 30 tablet, Refills: 2      CONTINUE these medications which have NOT CHANGED   Details  acetaminophen (TYLENOL) 325 MG  tablet Take 2 tablets (650 mg total) by mouth every 6 (six) hours as needed for mild pain.    ALPRAZolam (XANAX) 0.5 MG tablet Take 0.5 mg by mouth at bedtime as needed for anxiety.    amLODipine (NORVASC) 10 MG tablet Take 10 mg by mouth daily.    atorvastatin (LIPITOR) 20 MG tablet Take 1 tablet (20 mg total) by mouth daily at 6 PM. Qty: 30 tablet, Refills: 2   Associated Diagnoses: Stroke (HCC); Hyperlipidemia    Cetirizine HCl 10 MG CAPS Take 10 mg by mouth daily.    clopidogrel (PLAVIX) 75 MG tablet Take 1 tablet (75 mg total) by mouth daily. Qty: 30 tablet, Refills: 3   Associated Diagnoses: Stroke (HCC)    Coenzyme Q10 (COQ10 PO) Take 1 capsule by mouth daily.    famotidine (PEPCID) 20 MG tablet Take 20 mg by mouth 2 (two) times daily.    Fluticasone-Salmeterol (ADVAIR) 250-50 MCG/DOSE AEPB Inhale 1 puff into the lungs 2 (two) times daily.    losartan-hydrochlorothiazide (HYZAAR) 50-12.5 MG tablet Take 1 tablet by mouth daily.    NASACORT ALLERGY 24HR 55 MCG/ACT AERO nasal inhaler Inhale 2 sprays into the lungs daily as needed (for congestion).  Refills: 12    PROAIR HFA 108 (90 BASE) MCG/ACT inhaler Inhale 2 puffs into the lungs every 6 (six) hours as needed. Refills: 6      STOP taking these medications     naproxen (NAPROSYN) 500 MG tablet          DISCHARGE INSTRUCTIONS:    If you experience worsening of  your admission symptoms, develop shortness of breath, life threatening emergency, suicidal or homicidal thoughts you must seek medical attention immediately by calling 911 or calling your MD immediately  if symptoms less severe.  You Must read complete instructions/literature along with all the possible adverse reactions/side effects for all the Medicines you take and that have been prescribed to you. Take any new Medicines after you have completely understood and accept all the possible adverse reactions/side effects.   Please note  You were cared for by a  hospitalist during your hospital stay. If you have any questions about your discharge medications or the care you received while you were in the hospital after you are discharged, you can call the unit and asked to speak with the hospitalist on call if the hospitalist that took care of you is not available. Once you are discharged, your primary care physician will handle any further medical issues. Please note that NO REFILLS for any discharge medications will be authorized once you are discharged, as it is imperative that you return to your primary care physician (or establish a relationship with a primary care physician if you do not have one) for your aftercare needs so that they can reassess your need for medications and monitor your lab values.    Today   SUBJECTIVE   No complaint.  VITAL SIGNS:  Blood pressure 128/87, pulse 64, temperature 98.1 F (36.7 C), temperature source Oral, resp. rate 18, height 5\' 8"  (1.727 m), weight 205 lb 9.6 oz (93.26 kg), SpO2 96 %.  I/O:   Intake/Output Summary (Last 24 hours) at 08/06/15 1414 Last data filed at 08/06/15 0001  Gross per 24 hour  Intake      0 ml  Output    200 ml  Net   -200 ml    PHYSICAL EXAMINATION:  GENERAL:  50 y.o.-year-old patient lying in the bed with no acute distress.  EYES: Pupils equal, round, reactive to light and accommodation. No scleral icterus. Extraocular muscles intact.  HEENT: Head atraumatic, normocephalic. Oropharynx and nasopharynx clear.  NECK:  Supple, no jugular venous distention. No thyroid enlargement, no tenderness.  LUNGS: Normal breath sounds bilaterally, no wheezing, rales,rhonchi or crepitation. No use of accessory muscles of respiration.  CARDIOVASCULAR: S1, S2 normal. No murmurs, rubs, or gallops.  ABDOMEN: Soft, non-tender, non-distended. Bowel sounds present. No organomegaly or mass.  EXTREMITIES: No pedal edema, cyanosis, or clubbing.  NEUROLOGIC: Cranial nerves II through XII are intact.  Muscle strength 5/5 in all extremities. Sensation intact. Gait not checked.  PSYCHIATRIC: The patient is alert and oriented x 3.  SKIN: No obvious rash, lesion, or ulcer.   DATA REVIEW:   CBC  Recent Labs Lab 08/06/15 0523  WBC 9.4  HGB 14.6  HCT 42.5  PLT 211    Chemistries   Recent Labs Lab 08/05/15 1612 08/06/15 0523  NA 136 136  K 3.8 3.8  CL 103 104  CO2 25 23  GLUCOSE 94 93  BUN 11 12  CREATININE 1.00 0.85  CALCIUM 8.8* 8.1*  AST 31  --   ALT 37  --   ALKPHOS 63  --   BILITOT 0.6  --     Cardiac Enzymes  Recent Labs Lab 08/06/15 0523  TROPONINI <0.03    Microbiology Results  Results for orders placed or performed in visit on 04/18/13  Influenza A&B Antigens [facility])     Status: None   Collection Time: 04/18/13  1:15 PM  Result Value Ref  Range Status   Micro Text Report   Final       COMMENT                   NEGATIVE FOR INFLUENZA A (ANTIGEN ABSENT)   COMMENT                   NEGATIVE FOR INFLUENZA B (ANTIGEN ABSENT)   ANTIBIOTIC                                                      Culture, blood (single)     Status: None   Collection Time: 04/18/13  1:39 PM  Result Value Ref Range Status   Micro Text Report   Final       COMMENT                   NO GROWTH AEROBICALLY/ANAEROBICALLY IN 5 DAYS   ANTIBIOTIC                                                        RADIOLOGY:  Ct Head Wo Contrast  08/05/2015  CLINICAL DATA:  50 year old male with headache chest pain and nausea today. Code stroke. Initial encounter. EXAM: CT HEAD WITHOUT CONTRAST TECHNIQUE: Contiguous axial images were obtained from the base of the skull through the vertex without intravenous contrast. COMPARISON:  Brain MRI 01/23/2014.  Head CT 01/23/2014. FINDINGS: Stable and well pneumatized visualized paranasal sinuses and mastoids. No acute osseous abnormality identified. Visualized orbits and scalp soft tissues are within normal limits. Cerebral volume is stable and normal.  No midline shift, ventriculomegaly, mass effect, evidence of mass lesion, intracranial hemorrhage or evidence of cortically based acute infarction. Gray-white matter differentiation is within normal limits throughout the brain. No suspicious intracranial vascular hyperdensity. IMPRESSION: Stable and normal noncontrast CT appearance of the brain. Study discussed by telephone with Dr. Shaune Pollack on 08/05/2015 at 16:49 . Electronically Signed   By: Odessa Fleming M.D.   On: 08/05/2015 16:50   Mr Brain Wo Contrast  08/06/2015  CLINICAL DATA:  Chest pain and left arm pain. Hypertension. Left-sided weakness. EXAM: MRI HEAD WITHOUT CONTRAST TECHNIQUE: Multiplanar, multiecho pulse sequences of the brain and surrounding structures were obtained without intravenous contrast. COMPARISON:  CT head 08/06/2015, MRI 01/23/2014 FINDINGS: Ventricle size normal.  Cerebral volume is normal. Negative for acute or chronic infarction. Negative for demyelinating disease. Cerebral white matter is normal. Basal ganglia and brainstem normal. Negative for intracranial hemorrhage or fluid collection. Negative for mass or edema.  No shift of the midline structures. Minimal mucosal edema in the paranasal sinuses. Normal orbital structures. Circle of Willis patent. Pituitary and skull base normal. IMPRESSION: Normal MRI of the brain without contrast. Electronically Signed   By: Marlan Palau M.D.   On: 08/06/2015 13:11   US Carotid Bilateral  08/06/2015  CLINICAL DATA:  50 year old male with a history of dizziness EXAM: BILATERAL CAROTID DUPLEX ULTRASOUND TECHNIQUE: Wallace Cullens scale imaging, color Doppler and duplex ultrasound were performed of bilateral carotid and vertebral arteries in the neck. COMPARISON:  No prior duplex FINDINGS: Criteria: Quantification of carotid stenosis is based on velocity parameters  that correlate the residual internal carotid diameter with NASCET-based stenosis levels, using the diameter of the distal internal carotid lumen as  the denominator for stenosis measurement. The following velocity measurements were obtained: RIGHT ICA:  Systolic 84 cm/sec, Diastolic 38 cm/sec CCA:  90 cm/sec SYSTOLIC ICA/CCA RATIO:  0.9 ECA:  83 cm/sec LEFT ICA:  Systolic 87 cm/sec, Diastolic 39 cm/sec CCA:  93 cm/sec SYSTOLIC ICA/CCA RATIO:  0.9 ECA:  88 cm/sec Right Brachial SBP: Not acquired Left Brachial SBP: Not acquired RIGHT CAROTID ARTERY: No significant calcifications of the right common carotid artery. Intermediate waveform maintained. Heterogeneous and partially calcified plaque at the right carotid bifurcation. No significant lumen shadowing. Low resistance waveform of the right ICA. No significant tortuosity. RIGHT VERTEBRAL ARTERY: Antegrade flow with low resistance waveform. LEFT CAROTID ARTERY: No significant calcifications of the left common carotid artery. Intermediate waveform maintained. Heterogeneous and partially calcified plaque at the left carotid bifurcation without significant lumen shadowing. Low resistance waveform of the left ICA. No significant tortuosity. LEFT VERTEBRAL ARTERY:  Antegrade flow with low resistance waveform. IMPRESSION: Color duplex indicates minimal heterogeneous and calcified plaque, with no hemodynamically significant stenosis by duplex criteria in the extracranial cerebrovascular circulation. Signed, Yvone Neu. Loreta Ave, DO [facility] Electronically Signed   By: Gilmer Mor D.O.   On: 08/06/2015 12:18   Nm Myocar Multi W/spect W/wall Motion / Ef  08/06/2015   No T wave inversion was noted during stress.  There was no ST segment deviation noted during stress.  The study is normal.  This is a low risk study.  The left ventricular ejection fraction is normal (55-65%).    Ct Angio Chest Aorta W/cm &/or Wo/cm  08/05/2015  CLINICAL DATA:  Chest pain and headache.  Intermittent nausea. EXAM: CT ANGIOGRAPHY CHEST CT ABDOMEN AND PELVIS WITH AND WITHOUT  CONTRAST TECHNIQUE: Multidetector CT imaging of the chest was performed using the standard protocol during bolus administration of intravenous contrast. Multiplanar CT image reconstructions and MIPs were obtained to evaluate the vascular anatomy. Multidetector CT imaging of the abdomen and pelvis was performed using the standard protocol during bolus administration of intravenous contrast. CONTRAST:  125 cc of Isovue 370. COMPARISON:  COMPARISON None. FINDINGS: CTA CHEST FINDINGS Mediastinum/Lymph Nodes: No pulmonary emboli or thoracic aortic dissection identified. No masses or pathologically enlarged lymph nodes identified. The heart and great vessels are normal. Lungs/Pleura: No pulmonary mass, infiltrate, or effusion. Musculoskeletal: No chest wall mass or suspicious bone lesions identified. CT ABDOMEN and PELVIS FINDINGS Hepatobiliary: No mass visualized on this un-enhanced exam. Pancreas: No mass or inflammatory process identified on this un-enhanced exam. Spleen: Within normal limits in size. Adrenals/Urinary Tract: No evidence of urolithiasis or hydronephrosis. No definite mass visualized on this un-enhanced exam. Benign-appearing mostly exophytic 5 cm left renal cyst Stomach/Bowel: No evidence of obstruction, inflammatory process, or abnormal fluid collections. Scatter left colonic diverticulosis without evidence of diverticulitis. Vascular/Lymphatic: No pathologically enlarged lymph nodes. No evidence of abdominal aortic aneurysm. Reproductive: Heterogeneous appearance of the prostate gland, which measures 4.0 x 4.3 cm. Other: None. Musculoskeletal: No suspicious bone lesions identified. Review of the MIP images confirms the above findings. IMPRESSION: No evidence of pulmonary embolus. No evidence of acute abnormality within the abdomen. Benign-appearing left renal cyst. Left colonic diverticulosis without evidence of diverticulitis. Heterogeneous appearance of the prostate gland, nonspecific finding.  Correlation with serum PSA is recommended. Electronically Signed   By: Ted Mcalpine M.D.   On: 08/05/2015 19:39  Ct Cta Abd/pel W/cm &/or W/o Cm  08/05/2015  CLINICAL DATA:  Chest pain and headache.  Intermittent nausea. EXAM: CT ANGIOGRAPHY CHEST CT ABDOMEN AND PELVIS WITH AND WITHOUT CONTRAST TECHNIQUE: Multidetector CT imaging of the chest was performed using the standard protocol during bolus administration of intravenous contrast. Multiplanar CT image reconstructions and MIPs were obtained to evaluate the vascular anatomy. Multidetector CT imaging of the abdomen and pelvis was performed using the standard protocol during bolus administration of intravenous contrast. CONTRAST:  125 cc of Isovue 370. COMPARISON:  COMPARISON None. FINDINGS: CTA CHEST FINDINGS Mediastinum/Lymph Nodes: No pulmonary emboli or thoracic aortic dissection identified. No masses or pathologically enlarged lymph nodes identified. The heart and great vessels are normal. Lungs/Pleura: No pulmonary mass, infiltrate, or effusion. Musculoskeletal: No chest wall mass or suspicious bone lesions identified. CT ABDOMEN and PELVIS FINDINGS Hepatobiliary: No mass visualized on this un-enhanced exam. Pancreas: No mass or inflammatory process identified on this un-enhanced exam. Spleen: Within normal limits in size. Adrenals/Urinary Tract: No evidence of urolithiasis or hydronephrosis. No definite mass visualized on this un-enhanced exam. Benign-appearing mostly exophytic 5 cm left renal cyst Stomach/Bowel: No evidence of obstruction, inflammatory process, or abnormal fluid collections. Scatter left colonic diverticulosis without evidence of diverticulitis. Vascular/Lymphatic: No pathologically enlarged lymph nodes. No evidence of abdominal aortic aneurysm. Reproductive: Heterogeneous appearance of the prostate gland, which measures 4.0 x 4.3 cm. Other: None. Musculoskeletal: No suspicious bone lesions identified. Review of the MIP images  confirms the above findings. IMPRESSION: No evidence of pulmonary embolus. No evidence of acute abnormality within the abdomen. Benign-appearing left renal cyst. Left colonic diverticulosis without evidence of diverticulitis. Heterogeneous appearance of the prostate gland, nonspecific finding. Correlation with serum PSA is recommended. Electronically Signed   By: Ted Mcalpine M.D.   On: 08/05/2015 19:39        Management plans discussed with the patient, family and they are in agreement.  CODE STATUS:     Code Status Orders        Start     Ordered   08/05/15 2039  Full code   Continuous     08/05/15 2041    Code Status History    Date Active Date Inactive Code Status Order ID Comments User Context   01/22/2014  5:37 PM 01/25/2014  7:08 PM Full Code [id number]  Ritta Slot, MD ED      TOTAL TIME TAKING CARE OF THIS PATIENT: 36 minutes.    Shaune Pollack M.D on 08/06/2015 at 2:14 PM  Between 7am to 6pm - Pager - [phone]  After 6pm go to [email or web address] - password [email or web address] [facility]  [facility]  Office  [phone]  CC: Primary care physician; Barbette Reichmann, MD

## 2015-08-06 NOTE — Progress Notes (Signed)
Patient admitted to room 248 with the diagnosis of chest pain. Alert and oriented x 4. Patient oriented to his room, call bell/ascom and staff. Tele box verified by the RN and Candece NT. Skin assessment done with Tiffany M. RN, no skin issues of concern noted. Vicodin 1 tablet oral administered for c/o headache. On re-assessment, patient was resting quietly with his eyes closed, respirations even and unlabored. Will continue to monitor.

## 2015-12-07 ENCOUNTER — Emergency Department
Admission: EM | Admit: 2015-12-07 | Discharge: 2015-12-07 | Disposition: A | Discharge status: Home / Self Care | Payer: Managed Care, Other (non HMO) | Attending: Emergency Medicine | Admitting: Emergency Medicine

## 2015-12-07 ENCOUNTER — Encounter: Payer: Self-pay | Admitting: Emergency Medicine

## 2015-12-07 ENCOUNTER — Emergency Department: Payer: Managed Care, Other (non HMO)

## 2015-12-07 DIAGNOSIS — J45909 Unspecified asthma, uncomplicated: Secondary | ICD-10-CM | POA: Insufficient documentation

## 2015-12-07 DIAGNOSIS — Z791 Long term (current) use of non-steroidal anti-inflammatories (NSAID): Secondary | ICD-10-CM | POA: Diagnosis not present

## 2015-12-07 DIAGNOSIS — Z5321 Procedure and treatment not carried out due to patient leaving prior to being seen by health care provider: Secondary | ICD-10-CM | POA: Diagnosis not present

## 2015-12-07 DIAGNOSIS — I10 Essential (primary) hypertension: Secondary | ICD-10-CM | POA: Diagnosis not present

## 2015-12-07 DIAGNOSIS — R079 Chest pain, unspecified: Secondary | ICD-10-CM | POA: Diagnosis present

## 2015-12-07 DIAGNOSIS — Z79899 Other long term (current) drug therapy: Secondary | ICD-10-CM | POA: Diagnosis not present

## 2015-12-07 DIAGNOSIS — Z87891 Personal history of nicotine dependence: Secondary | ICD-10-CM | POA: Insufficient documentation

## 2015-12-07 LAB — BASIC METABOLIC PANEL
ANION GAP: 9 (ref 5–15)
BUN: 8 mg/dL (ref 6–20)
CO2: 27 mmol/L (ref 22–32)
Calcium: 9.2 mg/dL (ref 8.9–10.3)
Chloride: 103 mmol/L (ref 101–111)
Creatinine, Ser: 1 mg/dL (ref 0.61–1.24)
GFR calc Af Amer: >60 mL/min (ref >60)
GFR calc non Af Amer: >60 mL/min (ref >60)
GLUCOSE: 106 mg/dL — AB (ref 65–99)
POTASSIUM: 3.7 mmol/L (ref 3.5–5.1)
Sodium: 139 mmol/L (ref 135–145)

## 2015-12-07 LAB — CBC
HCT: 47.3 % (ref 40.0–52.0)
Hemoglobin: 16.5 g/dL (ref 13.0–18.0)
MCH: 30.5 pg (ref 26.0–34.0)
MCHC: 34.9 g/dL (ref 32.0–36.0)
MCV: 87.3 fL (ref 80.0–100.0)
PLATELETS: 270 10*3/uL (ref 150–440)
RBC: 5.42 MIL/uL (ref 4.40–5.90)
RDW: 13.2 % (ref 11.5–14.5)
WBC: 10 10*3/uL (ref 3.8–10.6)

## 2015-12-07 LAB — TROPONIN I

## 2015-12-07 NOTE — ED Triage Notes (Signed)
Pt to ed with c/o chest pain that started yesterday, intermittently. States he took 1 nitro last night and felt better, however pain came back tonight.

## 2016-04-17 ENCOUNTER — Emergency Department: Payer: Managed Care, Other (non HMO)

## 2016-04-17 ENCOUNTER — Emergency Department
Admission: EM | Admit: 2016-04-17 | Discharge: 2016-04-17 | Disposition: A | Discharge status: Home / Self Care | Payer: Managed Care, Other (non HMO) | Attending: Emergency Medicine | Admitting: Emergency Medicine

## 2016-04-17 ENCOUNTER — Encounter: Payer: Self-pay | Admitting: Emergency Medicine

## 2016-04-17 DIAGNOSIS — Z79891 Long term (current) use of opiate analgesic: Secondary | ICD-10-CM | POA: Diagnosis not present

## 2016-04-17 DIAGNOSIS — I10 Essential (primary) hypertension: Secondary | ICD-10-CM | POA: Insufficient documentation

## 2016-04-17 DIAGNOSIS — R531 Weakness: Secondary | ICD-10-CM | POA: Diagnosis present

## 2016-04-17 DIAGNOSIS — J45909 Unspecified asthma, uncomplicated: Secondary | ICD-10-CM | POA: Insufficient documentation

## 2016-04-17 DIAGNOSIS — Z79899 Other long term (current) drug therapy: Secondary | ICD-10-CM | POA: Insufficient documentation

## 2016-04-17 DIAGNOSIS — G459 Transient cerebral ischemic attack, unspecified: Secondary | ICD-10-CM | POA: Diagnosis not present

## 2016-04-17 LAB — BASIC METABOLIC PANEL
Anion gap: 9 (ref 5–15)
BUN: 14 mg/dL (ref 6–20)
CALCIUM: 8.9 mg/dL (ref 8.9–10.3)
CO2: 24 mmol/L (ref 22–32)
CREATININE: 0.93 mg/dL (ref 0.61–1.24)
Chloride: 103 mmol/L (ref 101–111)
GFR calc non Af Amer: >60 mL/min (ref >60)
Glucose, Bld: 120 mg/dL — ABNORMAL HIGH (ref 65–99)
Potassium: 4.1 mmol/L (ref 3.5–5.1)
SODIUM: 136 mmol/L (ref 135–145)

## 2016-04-17 LAB — CBC
HEMATOCRIT: 43.9 % (ref 40.0–52.0)
Hemoglobin: 16 g/dL (ref 13.0–18.0)
MCH: 31.9 pg (ref 26.0–34.0)
MCHC: 36.4 g/dL — AB (ref 32.0–36.0)
MCV: 87.5 fL (ref 80.0–100.0)
Platelets: 259 10*3/uL (ref 150–440)
RBC: 5.01 MIL/uL (ref 4.40–5.90)
RDW: 13 % (ref 11.5–14.5)
WBC: 8.2 10*3/uL (ref 3.8–10.6)

## 2016-04-17 LAB — TROPONIN I: Troponin I: <0.03 ng/mL (ref <0.03)

## 2016-04-17 NOTE — ED Notes (Signed)
Unsuccessful IV attempts by this RN and [facility] ED Medic. To not delay pt care, pt is being taken to MRI by Mardelle Matte, ED medic and will have someone [place] IV attempt when pt returns

## 2016-04-17 NOTE — Discharge Instructions (Signed)
Please follow-up with your neurologist Dr. Terrace Arabia within 1 week for reevaluation. Return to the emergency department for any new or worsening symptoms whatsoever such as numbness, weakness, headache, or for any other concerns.  Mr Brain 22 Contrast  Result Date: 04/17/2016 CLINICAL DATA:  51 year old hypertensive male presenting with left-sided weakness and amnesia. Initial encounter. EXAM: MRI HEAD WITHOUT CONTRAST TECHNIQUE: Multiplanar, multiecho pulse sequences of the brain and surrounding structures were obtained without intravenous contrast. COMPARISON:  08/06/2015. FINDINGS: Brain: No acute infarct or intracranial hemorrhage. No age advanced atrophy or hydrocephalus. No intracranial mass lesion noted on this unenhanced exam. Vascular: Major intracranial vascular structures are patent with small right vertebral artery once again noted. Skull and upper cervical spine: Negative Sinuses/Orbits: Minimal exophthalmos.  No acute orbital abnormality. Minimal mucosal thickening ethmoid sinus air cells. Other: Negative IMPRESSION: No acute intracranial abnormality. Specifically, no evidence of acute infarct. Electronically Signed   By: Lacy Duverney M.D.   On: 04/17/2016 17:10

## 2016-04-17 NOTE — ED Notes (Signed)
Pt states last night he had amnesia that last 2 minutes. Pt states L sided weakness after amnesia cleared up. Hx stroke Dec 2015. Full recovery. State "I have my moments but full recovery." Pt states he just feels weak all over, moving all extremities on own.

## 2016-04-17 NOTE — ED Provider Notes (Signed)
[facility] Emergency Department Provider Note  ____________________________________________   First MD Initiated Contact with Patient 04/17/16 1541     (approximate)  I have reviewed the triage vital signs and the nursing notes.   HISTORY  Chief Complaint Weakness    HPI Fernando Goodwin is a 51 y.o. male who comes to the emergency departmentwith the constellation of neuro symptoms that began at 11 PM last night roughly hours prior to arrival. He was driving his truck to [place] and when he got into the store he had a brief episode of amnesia and couldn't remember driving to the store or where he was. His wife then noted that he had an antalgic gait and had facial droop. He did not have any slurred speech. He has a history of a cerebrovascular accident in 2015 for which she takes Plavix. He is no longer followed by neurology. He also had some vague chest discomfort at that time and went home and took a nitroglycerin then went to bed. Today his wife has noted that his gait is still abnormal and the patient reports numbness to the left side of his body so she finally came to the emergency department for evaluation. He denies headache. He denies double vision or blurred vision. He denies chest pain or shortness of breath currently. He denies abdominal pain nausea or vomiting.  04/13/16 PMD note:  Assessment and Plan: 1 Atypical chest pain: Most likely related to stress and anxiety  On Paxil to CR 25 mg po qd  Recent stress test was negative  2 HTN; Stable- -On Losartan/HCT 100/12.5 1 tab po qd  3 Anxiety Xanax 0.5 mg po qd prn and Paxil 4 Hx of CVA and left sided weakness in Dec 2015  Pt has made a complete recovery. Continue Plavix and Lipitor 5 Hemorrhoids; Resolved - Underwent surgery by Dr. 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 Up to date with Pneumovax  Wants to hold off on  Colonoscopy  Check cbc, met-c, Lipids 1 week prior to next visit Follow up in 4 months      Past Medical History:  Diagnosis Date  . Arthritis   . Asthma   . Hypertension   . Stroke [facility])    2015    Patient Active Problem List   Diagnosis Date Noted  . Chest pain 08/05/2015  . 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 Complicated migraine 96/22/2979  . Essential hypertension 04/01/2014  . Hyperlipidemia 04/01/2014    Past Surgical History:  Procedure Laterality Date  . HERNIA REPAIR      Prior to Admission medications   Medication Sig Start Date End Date Taking? Authorizing Provider  acetaminophen (TYLENOL) 325 MG tablet Take 2 tablets (650 mg total) by mouth every 6 (six) hours as needed for mild pain. 01/25/14   David L Rinehuls, PA-C  ALPRAZolam (XANAX) 0.5 MG tablet Take 0.5 mg by mouth at bedtime as needed for anxiety.    Historical Provider, MD  amLODipine (NORVASC) 10 MG tablet Take 10 mg by mouth daily. 02/09/14 02/09/15  Historical Provider, MD  amLODipine (NORVASC) 10 MG tablet Take 10 mg by mouth daily.    Historical Provider, MD  atorvastatin (LIPITOR) 20 MG tablet Take 1 tablet (20 mg total) by mouth daily at 6 PM. 01/25/14   David L Rinehuls, PA-C  Cetirizine HCl 10 MG CAPS Take 10 mg by mouth daily.  Historical Provider, MD  clopidogrel (PLAVIX) 75 MG tablet Take 1 tablet (75 mg total) by mouth daily. 01/25/14   David L Rinehuls, PA-C  Coenzyme Q10 (COQ10 PO) Take 1 capsule by mouth daily.    Historical Provider, MD  famotidine (PEPCID) 20 MG tablet Take 20 mg by mouth 2 (two) times daily.    Historical Provider, MD  Fluticasone-Salmeterol (ADVAIR) 250-50 MCG/DOSE AEPB Inhale 1 puff into the lungs 2 (two) times daily.    Historical Provider, MD  losartan-hydrochlorothiazide (HYZAAR) 50-12.5 MG tablet Take 1 tablet by mouth daily.    Historical Provider, MD  metoprolol tartrate (LOPRESSOR) 25 MG tablet Take 1 tablet (25 mg total) by mouth 2 (two) times  daily. 08/06/15   Demetrios Loll, MD  NASACORT ALLERGY 24HR 55 MCG/ACT AERO nasal inhaler Inhale 2 sprays into the lungs daily as needed (for congestion).  01/12/14   Historical Provider, MD  nitroGLYCERIN (NITROSTAT) 0.4 MG SL tablet Place 1 tablet (0.4 mg total) under the tongue every 5 (five) minutes as needed for chest pain. 08/06/15   Demetrios Loll, MD  PROAIR HFA 108 (90 BASE) MCG/ACT inhaler Inhale 2 puffs into the lungs every 6 (six) hours as needed. 12/26/13   Historical Provider, MD    Allergies Patient has no known allergies.  Family History  Problem Relation Age of Onset  . Cancer Mother     Breast  . Dementia Father   . Hypertension Father     Social History Social History  Substance Use Topics  . Smoking status: Former Research scientist (life sciences)  . Smokeless tobacco: Never Used  . Alcohol use No    Review of Systems Constitutional: No fever/chills Eyes: No visual changes. ENT: No sore throat. Cardiovascular: Denies chest pain. Respiratory: Denies shortness of breath. Gastrointestinal: No abdominal pain.  No nausea, no vomiting.  No diarrhea.  No constipation. Genitourinary: Negative for dysuria. Musculoskeletal: Negative for back pain. Skin: Negative for rash. Neurological: Negative for headaches, Positive for focal weakness and numbness  10-point ROS otherwise negative.  ____________________________________________   PHYSICAL EXAM:  VITAL SIGNS: ED Triage Vitals  Enc Vitals Group     BP --      Pulse --      Resp --      Temp --      Temp src --      SpO2 --      Weight 04/17/16 1515 185 lb (83.9 kg)     Height 04/17/16 1515 '5\' 8"'$  (1.727 m)     Head Circumference --      Peak Flow --      Pain Score 04/17/16 1516 0     Pain Loc --      Pain Edu? --      Excl. in [place]? --     Constitutional: Alert and oriented x 4 well appearing nontoxic no diaphoresis speaks in full, clear sentences Eyes: PERRL EOMI. Head: Atraumatic. Nose: No congestion/rhinnorhea. Mouth/Throat: No  trismus Neck: No stridor.   Cardiovascular: Normal rate, regular rhythm. Grossly normal heart sounds.  Good peripheral circulation. Respiratory: Normal respiratory effort.  No retractions. Lungs CTAB and moving good air Gastrointestinal: Soft nondistended nontender no rebound no guarding no peritonitis no McBurney's tenderness negative Rovsing's no costovertebral tenderness negative Murphy's Musculoskeletal: No lower extremity edema   Neurologic:  Normal speech and language.  CN 2-12 intact PERRL 5-3B eomi no nystagmus No pronator drift 5/5 grips, biceps, triceps, hip flexion, hip extension, plantar flexion, dorsiflexion 3+ DTRs bilaterally no  ankle clonus Normal finger nose finger Positive romberg Unable to perform heel to toe walk Skin:  Skin is warm, dry and intact. No rash noted. Psychiatric: Mood and affect are normal. Speech and behavior are normal.  ____________________________________________   LABS (all labs ordered are listed, but only abnormal results are displayed)  Labs Reviewed  BASIC METABOLIC PANEL - Abnormal; Notable for the following:       Result Value   Glucose, Bld 120 (*)    All other components within normal limits  CBC - Abnormal; Notable for the following:    MCHC 36.4 (*)    All other components within normal limits  TROPONIN I   ____________________________________________  EKG  ED ECG REPORT I, Darel Hong, the attending physician, personally viewed and interpreted this ECG.  Date: 04/17/2016 EKG Time: 1513 Rate: 70 Rhythm: normal sinus rhythm QRS Axis: normal Intervals: normal ST/T Wave abnormalities: normal Conduction Disturbances: none Narrative Interpretation: unremarkable  ____________________________________________  RADIOLOGY  MRI negative for acute infarct ____________________________________________   PROCEDURES  Procedure(s) performed: no  Procedures  Critical Care performed:  no  ____________________________________________   INITIAL IMPRESSION / ASSESSMENT AND PLAN / ED COURSE  Pertinent labs & imaging results that were available during my care of the patient were reviewed by me and considered in my medical decision making (see chart for details).  The patient's symptoms are concerning for TIA versus new infarct. Fortunately MRI was available and I was able to get a rapid MRI of his brain which showed no acute infarct. He is currently neurologically intact and his ambulation has improved. I had a lengthy discussion with the patient and his wife regarding his symptoms and the need for follow-up with neurology. At this time I do not believe she requires inpatient admission for medical optimization as he was medically optimized less than 3 years ago and is currently taking an appropriate regimen. He says that he is able to follow up with his neurologist within 1 week for recheck and if not to return to our hospital. At this point he is medically stable for outpatient management.      ____________________________________________   FINAL CLINICAL IMPRESSION(S) / ED DIAGNOSES  Final diagnoses:  Transient cerebral ischemia, unspecified type      NEW MEDICATIONS STARTED DURING THIS VISIT:  Discharge Medication List as of 04/17/2016  5:53 PM       Note:  This document was prepared using Dragon voice recognition software and may include unintentional dictation errors.     Darel Hong, MD 04/18/16 Dyann Kief

## 2016-04-17 NOTE — ED Triage Notes (Addendum)
C/O left sided weakness, onset of symptoms last night around 11 pm.  C/O left arm and leg weakness, arm greater than leg.  C/O generalized fatigue.  Also states he some chest pain last night, which patient took 1 NTG, somewhat relieved pain.

## 2016-04-17 NOTE — ED Notes (Signed)
Pt states he is sleepy. Wakes up to voice.

## 2016-04-17 NOTE — ED Notes (Signed)
Pt given cup of water, ok per Dr. Lamont Snowball.

## 2016-08-15 ENCOUNTER — Emergency Department (HOSPITAL_COMMUNITY): Payer: Managed Care, Other (non HMO)

## 2016-08-15 ENCOUNTER — Observation Stay (HOSPITAL_COMMUNITY)
Admission: EM | Admit: 2016-08-15 | Discharge: 2016-08-16 | Disposition: A | Discharge status: Home / Self Care | Payer: Managed Care, Other (non HMO) | Attending: Internal Medicine | Admitting: Internal Medicine

## 2016-08-15 ENCOUNTER — Encounter (HOSPITAL_COMMUNITY): Payer: Self-pay

## 2016-08-15 DIAGNOSIS — E785 Hyperlipidemia, unspecified: Secondary | ICD-10-CM | POA: Diagnosis not present

## 2016-08-15 DIAGNOSIS — Z7902 Long term (current) use of antithrombotics/antiplatelets: Secondary | ICD-10-CM | POA: Insufficient documentation

## 2016-08-15 DIAGNOSIS — G459 Transient cerebral ischemic attack, unspecified: Principal | ICD-10-CM | POA: Insufficient documentation

## 2016-08-15 DIAGNOSIS — J45909 Unspecified asthma, uncomplicated: Secondary | ICD-10-CM | POA: Insufficient documentation

## 2016-08-15 DIAGNOSIS — M542 Cervicalgia: Secondary | ICD-10-CM | POA: Diagnosis present

## 2016-08-15 DIAGNOSIS — Z7951 Long term (current) use of inhaled steroids: Secondary | ICD-10-CM | POA: Diagnosis not present

## 2016-08-15 DIAGNOSIS — G451 Carotid artery syndrome (hemispheric): Secondary | ICD-10-CM | POA: Insufficient documentation

## 2016-08-15 DIAGNOSIS — F1010 Alcohol abuse, uncomplicated: Secondary | ICD-10-CM | POA: Diagnosis not present

## 2016-08-15 DIAGNOSIS — I639 Cerebral infarction, unspecified: Secondary | ICD-10-CM

## 2016-08-15 DIAGNOSIS — Z87891 Personal history of nicotine dependence: Secondary | ICD-10-CM | POA: Diagnosis not present

## 2016-08-15 DIAGNOSIS — Z79899 Other long term (current) drug therapy: Secondary | ICD-10-CM | POA: Insufficient documentation

## 2016-08-15 DIAGNOSIS — I10 Essential (primary) hypertension: Secondary | ICD-10-CM | POA: Diagnosis not present

## 2016-08-15 LAB — CBC
HCT: 45.8 % (ref 39.0–52.0)
HEMOGLOBIN: 16.1 g/dL (ref 13.0–17.0)
MCH: 31.1 pg (ref 26.0–34.0)
MCHC: 35.2 g/dL (ref 30.0–36.0)
MCV: 88.4 fL (ref 78.0–100.0)
Platelets: 229 10*3/uL (ref 150–400)
RBC: 5.18 MIL/uL (ref 4.22–5.81)
RDW: 12.2 % (ref 11.5–15.5)
WBC: 7.6 10*3/uL (ref 4.0–10.5)

## 2016-08-15 LAB — I-STAT CHEM 8, ED
BUN: 15 mg/dL (ref 6–20)
CREATININE: 0.9 mg/dL (ref 0.61–1.24)
Calcium, Ion: 1.02 mmol/L — ABNORMAL LOW (ref 1.15–1.40)
Chloride: 101 mmol/L (ref 101–111)
GLUCOSE: 94 mg/dL (ref 65–99)
HCT: 46 % (ref 39.0–52.0)
HEMOGLOBIN: 15.6 g/dL (ref 13.0–17.0)
Potassium: 3.7 mmol/L (ref 3.5–5.1)
Sodium: 137 mmol/L (ref 135–145)
TCO2: 27 mmol/L (ref 0–100)

## 2016-08-15 LAB — COMPREHENSIVE METABOLIC PANEL
ALBUMIN: 4.6 g/dL (ref 3.5–5.0)
ALK PHOS: 55 U/L (ref 38–126)
ALT: 27 U/L (ref 17–63)
ANION GAP: 9 (ref 5–15)
AST: 27 U/L (ref 15–41)
BILIRUBIN TOTAL: 1 mg/dL (ref 0.3–1.2)
BUN: 11 mg/dL (ref 6–20)
CALCIUM: 9.1 mg/dL (ref 8.9–10.3)
CO2: 27 mmol/L (ref 22–32)
Chloride: 101 mmol/L (ref 101–111)
Creatinine, Ser: 1.02 mg/dL (ref 0.61–1.24)
Glucose, Bld: 103 mg/dL — ABNORMAL HIGH (ref 65–99)
POTASSIUM: 3.7 mmol/L (ref 3.5–5.1)
Sodium: 137 mmol/L (ref 135–145)
TOTAL PROTEIN: 7 g/dL (ref 6.5–8.1)

## 2016-08-15 LAB — DIFFERENTIAL
Basophils Absolute: 0.1 10*3/uL (ref 0.0–0.1)
Basophils Relative: 1 %
EOS ABS: 0.2 10*3/uL (ref 0.0–0.7)
EOS PCT: 2 %
LYMPHS ABS: 2.3 10*3/uL (ref 0.7–4.0)
Lymphocytes Relative: 31 %
MONO ABS: 0.9 10*3/uL (ref 0.1–1.0)
MONOS PCT: 12 %
NEUTROS PCT: 54 %
Neutro Abs: 4.2 10*3/uL (ref 1.7–7.7)

## 2016-08-15 LAB — CBG MONITORING, ED: GLUCOSE-CAPILLARY: 91 mg/dL (ref 65–99)

## 2016-08-15 LAB — I-STAT TROPONIN, ED: Troponin i, poc: 0.01 ng/mL (ref 0.00–0.08)

## 2016-08-15 LAB — PROTIME-INR
INR: 0.93
Prothrombin Time: 12.4 seconds (ref 11.4–15.2)

## 2016-08-15 LAB — APTT: aPTT: 28 seconds (ref 24–36)

## 2016-08-15 MED ORDER — ONDANSETRON HCL 4 MG/2ML IJ SOLN: 4.0000 mg | Freq: Four times a day (QID) | INTRAMUSCULAR | Status: DC | PRN

## 2016-08-15 MED ORDER — ALBUTEROL SULFATE (2.5 MG/3ML) 0.083% IN NEBU: 2.5000 mg | INHALATION_SOLUTION | Freq: Four times a day (QID) | RESPIRATORY_TRACT | Status: DC | PRN

## 2016-08-15 MED ORDER — ACETAMINOPHEN 325 MG PO TABS: 650.0000 mg | ORAL_TABLET | ORAL | Status: DC | PRN

## 2016-08-15 MED ORDER — ACETAMINOPHEN 325 MG PO TABS: 650.0000 mg | ORAL_TABLET | Freq: Four times a day (QID) | ORAL | Status: DC | PRN

## 2016-08-15 MED ORDER — STROKE: EARLY STAGES OF RECOVERY BOOK
Freq: Once | Status: AC
  Administered 2016-08-15
  Filled 2016-08-15: qty 1

## 2016-08-15 MED ORDER — TRIAMCINOLONE ACETONIDE 55 MCG/ACT NA AERO
2.0000 | INHALATION_SPRAY | Freq: Every day | NASAL | Status: DC | PRN
  Filled 2016-08-15: qty 21.6

## 2016-08-15 MED ORDER — SENNOSIDES-DOCUSATE SODIUM 8.6-50 MG PO TABS: 1.0000 | ORAL_TABLET | Freq: Every evening | ORAL | Status: DC | PRN

## 2016-08-15 MED ORDER — ACETAMINOPHEN 650 MG RE SUPP: 650.0000 mg | RECTAL | Status: DC | PRN

## 2016-08-15 MED ORDER — HYDROCHLOROTHIAZIDE 12.5 MG PO CAPS
12.5000 mg | ORAL_CAPSULE | Freq: Every day | ORAL | Status: DC
  Administered 2016-08-16: 12.5 mg via ORAL
  Filled 2016-08-15: qty 1

## 2016-08-15 MED ORDER — AMLODIPINE BESYLATE 5 MG PO TABS
10.0000 mg | ORAL_TABLET | Freq: Every day | ORAL | Status: DC
Start: 2016-08-16 — End: 2016-08-16
  Administered 2016-08-16: 10 mg via ORAL
  Filled 2016-08-15: qty 2

## 2016-08-15 MED ORDER — FLUTICASONE FUROATE-VILANTEROL 200-25 MCG/INH IN AEPB
1.0000 | INHALATION_SPRAY | Freq: Every day | RESPIRATORY_TRACT | Status: DC
  Filled 2016-08-15: qty 28

## 2016-08-15 MED ORDER — LOSARTAN POTASSIUM-HCTZ 100-12.5 MG PO TABS: 1.0000 | ORAL_TABLET | Freq: Every day | ORAL | Status: DC

## 2016-08-15 MED ORDER — LORATADINE 10 MG PO TABS
10.0000 mg | ORAL_TABLET | Freq: Every day | ORAL | Status: DC
  Administered 2016-08-16: 10 mg via ORAL
  Filled 2016-08-15: qty 1

## 2016-08-15 MED ORDER — ATORVASTATIN CALCIUM 20 MG PO TABS
20.0000 mg | ORAL_TABLET | Freq: Every day | ORAL | Status: DC
  Administered 2016-08-16: 20 mg via ORAL
  Filled 2016-08-15: qty 1
  Filled 2016-08-15: qty 2

## 2016-08-15 MED ORDER — ENOXAPARIN SODIUM 40 MG/0.4ML ~~LOC~~ SOLN
40.0000 mg | SUBCUTANEOUS | Status: DC
  Administered 2016-08-15: 40 mg via SUBCUTANEOUS
  Filled 2016-08-15: qty 0.4

## 2016-08-15 MED ORDER — ACETAMINOPHEN 160 MG/5ML PO SOLN: 650.0000 mg | ORAL | Status: DC | PRN

## 2016-08-15 MED ORDER — IOPAMIDOL (ISOVUE-370) INJECTION 76%
INTRAVENOUS | Status: AC
  Filled 2016-08-15: qty 50

## 2016-08-15 MED ORDER — ALPRAZOLAM 0.25 MG PO TABS: 0.5000 mg | ORAL_TABLET | Freq: Every evening | ORAL | Status: DC | PRN

## 2016-08-15 MED ORDER — FAMOTIDINE 20 MG PO TABS
20.0000 mg | ORAL_TABLET | Freq: Every day | ORAL | Status: DC
  Administered 2016-08-16: 20 mg via ORAL
  Filled 2016-08-15: qty 1

## 2016-08-15 MED ORDER — LOSARTAN POTASSIUM 50 MG PO TABS
100.0000 mg | ORAL_TABLET | Freq: Every day | ORAL | Status: DC
  Administered 2016-08-16: 100 mg via ORAL
  Filled 2016-08-15: qty 2

## 2016-08-15 MED ORDER — CLOPIDOGREL BISULFATE 75 MG PO TABS
75.0000 mg | ORAL_TABLET | Freq: Every day | ORAL | Status: DC
Start: 2016-08-16 — End: 2016-08-16
  Administered 2016-08-16: 75 mg via ORAL
  Filled 2016-08-15: qty 1

## 2016-08-15 NOTE — H&P (Signed)
History and Physical    Fernando Goodwin JGG:[id number] DOB: 12/19/1965 DOA: 08/15/2016  PCP: Barbette Reichmann, MD  Patient coming from: Home  I have personally briefly reviewed patient's old medical records in [facility]  Chief Complaint: L sided weakness  HPI: Fernando Goodwin is a 51 y.o. male with medical history significant of HTN, TIA x3 (2015, 2017, and Feb of 2018).  All of his Prior TIAs (and todays) have been hemispheric symptoms with associated L sided weakness.  Today he developed L sided arm weakness, facial droop.  Symptoms onset at 1630, improved by time he arrived in ED.  He admits to EtOH use, drinking can be heavy at times, and he states he can develop tremors in hands "to the point where he can notice it" the morning after heavy drinking.  No h/o DTs.   ED Course: Seen by neurology.  Symptoms are rapidly improving / resolved at this time.   Review of Systems: As per HPI otherwise 10 point review of systems negative.   Past Medical History:  Diagnosis Date  . Arthritis   . Asthma   . Hypertension   . Stroke [facility])    2015    Past Surgical History:  Procedure Laterality Date  . HERNIA REPAIR       reports that he has quit smoking. He has never used smokeless tobacco. He reports that he drinks alcohol. He reports that he does not use drugs.  No Known Allergies  Family History  Problem Relation Age of Onset  . Cancer Mother        Breast  . Dementia Father   . Hypertension Father      Prior to Admission medications   Medication Sig Start Date End Date Taking? Authorizing Provider  acetaminophen (TYLENOL) 325 MG tablet Take 2 tablets (650 mg total) by mouth every 6 (six) hours as needed for mild pain. 01/25/14   Rinehuls, Kinnie Scales, PA-C  ALPRAZolam Prudy Feeler) 0.5 MG tablet Take 0.5 mg by mouth at bedtime as needed for anxiety.    [provider]  amLODipine (NORVASC) 10 MG tablet Take 10 mg by mouth daily. 02/09/14 02/09/15  [provider]  amLODipine (NORVASC) 10 MG tablet Take 10 mg by mouth daily.    [provider]  atorvastatin (LIPITOR) 20 MG tablet Take 1 tablet (20 mg total) by mouth daily at 6 PM. 01/25/14   Rinehuls, Kinnie Scales, PA-C  Cetirizine HCl 10 MG CAPS Take 10 mg by mouth daily.    [provider]  clopidogrel (PLAVIX) 75 MG tablet Take 1 tablet (75 mg total) by mouth daily. 01/25/14   Rinehuls, Kinnie Scales, PA-C  Coenzyme Q10 (COQ10 PO) Take 1 capsule by mouth daily.    [provider]  famotidine (PEPCID) 20 MG tablet Take 20 mg by mouth 2 (two) times daily.    [provider]  Fluticasone-Salmeterol (ADVAIR) 250-50 MCG/DOSE AEPB Inhale 1 puff into the lungs 2 (two) times daily.    [provider]  losartan-hydrochlorothiazide (HYZAAR) 50-12.5 MG tablet Take 1 tablet by mouth daily.    [provider]  metoprolol tartrate (LOPRESSOR) 25 MG tablet Take 1 tablet (25 mg total) by mouth 2 (two) times daily. 08/06/15   Shaune Pollack, MD  NASACORT ALLERGY 24HR 55 MCG/ACT AERO nasal inhaler Inhale 2 sprays into the lungs daily as needed (for congestion).  01/12/14   [provider]  nitroGLYCERIN (NITROSTAT) 0.4 MG SL tablet Place 1 tablet (  0.4 mg total) under the tongue every 5 (five) minutes as needed for chest pain. 08/06/15   Shaune Pollack, MD  PROAIR HFA 108 (90 BASE) MCG/ACT inhaler Inhale 2 puffs into the lungs every 6 (six) hours as needed. 12/26/13   [provider]    Physical Exam: Vitals:   08/15/16 1833 08/15/16 1845 08/15/16 1900 08/15/16 1915  BP: 131/87 (!) 134/91 131/84 123/84  Pulse: 64 63 (!) 59 (!) 58  Resp: 20 18 19 16   SpO2: 97% 100% 99% 98%    Constitutional: NAD, calm, comfortable Eyes: PERRL, lids and conjunctivae normal ENMT: Mucous membranes are moist. Posterior pharynx clear of any exudate or lesions.Normal dentition.  Neck: normal, supple, no masses, no thyromegaly Respiratory: clear to auscultation bilaterally, no  wheezing, no crackles. Normal respiratory effort. No accessory muscle use.  Cardiovascular: Regular rate and rhythm, no murmurs / rubs / gallops. No extremity edema. 2+ pedal pulses. No carotid bruits.  Abdomen: no tenderness, no masses palpated. No hepatosplenomegaly. Bowel sounds positive.  Musculoskeletal: no clubbing / cyanosis. No joint deformity upper and lower extremities. Good ROM, no contractures. Normal muscle tone.  Skin: no rashes, lesions, ulcers. No induration Neurologic: CN 2-12 grossly intact. Sensation intact, DTR normal. Strength 5/5 in all 4.  Psychiatric: Normal judgment and insight. Alert and oriented x 3. Normal mood.    Labs on Admission: I have personally reviewed following labs and imaging studies  CBC:  Recent Labs Lab 08/15/16 1758 08/15/16 1807  WBC 7.6  --   NEUTROABS 4.2  --   HGB 16.1 15.6  HCT 45.8 46.0  MCV 88.4  --   PLT 229  --    Basic Metabolic Panel:  Recent Labs Lab 08/15/16 1758 08/15/16 1807  NA 137 137  K 3.7 3.7  CL 101 101  CO2 27  --   GLUCOSE 103* 94  BUN 11 15  CREATININE 1.02 0.90  CALCIUM 9.1  --    GFR: CrCl cannot be calculated (Unknown ideal weight.). Liver Function Tests:  Recent Labs Lab 08/15/16 1758  AST 27  ALT 27  ALKPHOS 55  BILITOT 1.0  PROT 7.0  ALBUMIN 4.6   No results for input(s): LIPASE, AMYLASE in the last 168 hours. No results for input(s): AMMONIA in the last 168 hours. Coagulation Profile:  Recent Labs Lab 08/15/16 1758  INR 0.93   Cardiac Enzymes: No results for input(s): CKTOTAL, CKMB, CKMBINDEX, TROPONINI in the last 168 hours. BNP (last 3 results) No results for input(s): PROBNP in the last 8760 hours. HbA1C: No results for input(s): HGBA1C in the last 72 hours. CBG:  Recent Labs Lab 08/15/16 1934  GLUCAP 91   Lipid Profile: No results for input(s): CHOL, HDL, LDLCALC, TRIG, CHOLHDL, LDLDIRECT in the last 72 hours. Thyroid Function Tests: No results for input(s): TSH,  T4TOTAL, FREET4, T3FREE, THYROIDAB in the last 72 hours. Anemia Panel: No results for input(s): VITAMINB12, FOLATE, FERRITIN, TIBC, IRON, RETICCTPCT in the last 72 hours. Urine analysis:    Component Value Date/Time   COLORURINE YELLOW 01/22/2014 1545   APPEARANCEUR CLEAR 01/22/2014 1545   LABSPEC 1.009 01/22/2014 1545   PHURINE 6.5 01/22/2014 1545   GLUCOSEU NEGATIVE 01/22/2014 1545   HGBUR NEGATIVE 01/22/2014 1545   BILIRUBINUR NEGATIVE 01/22/2014 1545   KETONESUR NEGATIVE 01/22/2014 1545   PROTEINUR NEGATIVE 01/22/2014 1545   UROBILINOGEN 0.2 01/22/2014 1545   NITRITE NEGATIVE 01/22/2014 1545   LEUKOCYTESUR NEGATIVE 01/22/2014 1545    Radiological Exams on Admission:  Ct Head Code Stroke W/o Cm  Result Date: 08/15/2016 CLINICAL DATA:  Code stroke.  Left eye visual disturbance EXAM: CT HEAD WITHOUT CONTRAST TECHNIQUE: Contiguous axial images were obtained from the base of the skull through the vertex without intravenous contrast. COMPARISON:  Brain MRI 04/17/2016 FINDINGS: Brain: No mass lesion or acute hemorrhage. No focal hypoattenuation of the basal ganglia or cortex to indicate infarcted tissue. No hydrocephalus or age advanced atrophy. Vascular: No hyperdense vessel. No advanced atherosclerotic calcification of the arteries at the skull base. Skull: Normal visualized skull base, calvarium and extracranial soft tissues. Sinuses/Orbits: No sinus fluid levels or advanced mucosal thickening. No mastoid effusion. Normal orbits. ASPECTS [facility] Stroke Program Early CT Score) - Ganglionic level infarction (caudate, lentiform nuclei, internal capsule, insula, M1-M3 cortex): 7 - Supraganglionic infarction (M4-M6 cortex): 3 Total score (0-10 with 10 being normal): 10 IMPRESSION: 1. Normal head CT. 2. ASPECTS is 10. The on call stroke neurologist was paged at 6:20 p.m. Electronically Signed   By: Deatra Robinson M.D.   On: 08/15/2016 18:22    EKG: Independently  reviewed.  Assessment/Plan Principal Problem:   TIA (transient ischemic attack) Active Problems:   Hemispheric carotid artery syndrome   Essential hypertension   ETOH abuse    1. TIA - vs recurrent partial seizures, perhaps associated with chronic EtOH use? 1. TIA work up and pathway 2. Continue plavix 3. MRI/MRA - note that all 3 prior MRIs have been negative, no significant disease on prior MRA (x1 in 2015). 4. Carotid dopplers - though his carotids had no significant disease on prior studies 5. Lipid panel - though his cholesterol's have been running some of the best that I have seen in a while (HDLs > LDLs since starting statin in 2015, and even before that his LDL was only 79) 6. A1C - though this has always been normal and no history of DM 7. 2d echo - previously normal, note that he also had stress test within past year that was neg 8. Tele monitor 9. Are we sure that he might not have reduced seizure threshold from his admittedly sometimes heavy EtOH use (admits to binge drinking last night) causing recurrent R sided partial seizures?  He also states that he does get tremors the morning after "to the point where he can notice it". 1. Will order an EEG 2. HTN - continue home meds  DVT prophylaxis: Lovenox Code Status: Full Family Communication: Wife at bedside Disposition Plan: Home after admit Consults called: Neuro has seen patient Admission status: Place in [place], [place]wood Iles. DO [facility] Pager [phone]  If 7AM-7PM, please contact day team taking care of patient [email or web address] Password [facility]  08/15/2016, 8:09 PM

## 2016-08-15 NOTE — ED Provider Notes (Signed)
[facility] DEPT Provider Note   CSN: [id number] Arrival date & time: 08/15/16  1755     History   Chief Complaint Chief Complaint  Patient presents with  . Code Stroke    HPI Fernando Goodwin is a 51 y.o. male.  51 yo M with a chief complaint of sudden onset neck pain left arm weakness and numbness to his face. Also having some difficulty with vision of the left eye. Was made a code stroke. Symptoms had started resolving his arrival to the CT. By my exam the patient had regained full strength in the left arm though he does feel some subjective weakness. Denies head injury or neck injury.   The history is provided by the patient and the spouse.  Illness  This is a new problem. The current episode started 1 to 2 hours ago. The problem occurs constantly. The problem has been resolved. Pertinent negatives include no chest pain, no abdominal pain, no headaches and no shortness of breath. Nothing aggravates the symptoms. Nothing relieves the symptoms. He has tried nothing for the symptoms.    Past Medical History:  Diagnosis Date  . Arthritis   . Asthma   . Hypertension   . Stroke [facility])    2015    Patient Active Problem List   Diagnosis Date Noted  . Chest pain 08/05/2015  . 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 Essential hypertension 04/01/2014  . Hyperlipidemia 04/01/2014    Past Surgical History:  Procedure Laterality Date  . HERNIA REPAIR         Home Medications    Prior to Admission medications   Medication Sig Start Date End Date Taking? Authorizing Provider  acetaminophen (TYLENOL) 325 MG tablet Take 2 tablets (650 mg total) by mouth every 6 (six) hours as needed for mild pain. 01/25/14   Rinehuls, Kinnie Scales, PA-C  ALPRAZolam Prudy Feeler) 0.5 MG tablet Take 0.5 mg by mouth at bedtime as needed for anxiety.    [provider]  amLODipine (NORVASC) 10 MG tablet Take 10 mg by mouth daily. 02/09/14 02/09/15   [provider]  amLODipine (NORVASC) 10 MG tablet Take 10 mg by mouth daily.    [provider]  atorvastatin (LIPITOR) 20 MG tablet Take 1 tablet (20 mg total) by mouth daily at 6 PM. 01/25/14   Rinehuls, Kinnie Scales, PA-C  Cetirizine HCl 10 MG CAPS Take 10 mg by mouth daily.    [provider]  clopidogrel (PLAVIX) 75 MG tablet Take 1 tablet (75 mg total) by mouth daily. 01/25/14   Rinehuls, Kinnie Scales, PA-C  Coenzyme Q10 (COQ10 PO) Take 1 capsule by mouth daily.    [provider]  famotidine (PEPCID) 20 MG tablet Take 20 mg by mouth 2 (two) times daily.    [provider]  Fluticasone-Salmeterol (ADVAIR) 250-50 MCG/DOSE AEPB Inhale 1 puff into the lungs 2 (two) times daily.    [provider]  losartan-hydrochlorothiazide (HYZAAR) 50-12.5 MG tablet Take 1 tablet by mouth daily.    [provider]  metoprolol tartrate (LOPRESSOR) 25 MG tablet Take 1 tablet (25 mg total) by mouth 2 (two) times daily. 08/06/15   Shaune Pollack, MD  NASACORT ALLERGY 24HR 55 MCG/ACT AERO nasal inhaler Inhale 2 sprays into the lungs daily as needed (for congestion).  01/12/14   [provider]  nitroGLYCERIN (NITROSTAT) 0.4 MG SL tablet Place 1 tablet (0.4 mg total) under the tongue every 5 (five) minutes  as needed for chest pain. 08/06/15   Shaune Pollack, MD  PROAIR HFA 108 (90 BASE) MCG/ACT inhaler Inhale 2 puffs into the lungs every 6 (six) hours as needed. 12/26/13   [provider]    Family History Family History  Problem Relation Age of Onset  . Cancer Mother        Breast  . Dementia Father   . Hypertension Father     Social History Social History  Substance Use Topics  . Smoking status: Former Games developer  . Smokeless tobacco: Never Used  . Alcohol use No     Allergies   Patient has no known allergies.   Review of Systems Review of Systems  Constitutional: Negative for chills and fever.  HENT: Negative for congestion and facial  swelling.   Eyes: Negative for discharge and visual disturbance.  Respiratory: Negative for shortness of breath.   Cardiovascular: Negative for chest pain and palpitations.  Gastrointestinal: Negative for abdominal pain, diarrhea and vomiting.  Musculoskeletal: Negative for arthralgias and myalgias.  Skin: Negative for color change and rash.  Neurological: Positive for weakness and numbness. Negative for tremors, syncope and headaches.  Psychiatric/Behavioral: Negative for confusion and dysphoric mood.     Physical Exam Updated Vital Signs BP 131/84   Pulse (!) 59   Resp 19   SpO2 99%   Physical Exam  Constitutional: He is oriented to person, place, and time. He appears well-developed and well-nourished.  HENT:  Head: Normocephalic and atraumatic.  Eyes: EOM are normal. Pupils are equal, round, and reactive to light.  Neck: Normal range of motion. Neck supple. No JVD present.  Cardiovascular: Normal rate and regular rhythm.  Exam reveals no gallop and no friction rub.   No murmur heard. Pulmonary/Chest: No respiratory distress. He has no wheezes.  Abdominal: He exhibits no distension and no mass. There is no tenderness. There is no rebound and no guarding.  Musculoskeletal: Normal range of motion.  Neurological: He is alert and oriented to person, place, and time. He displays no Babinski's sign on the right side. He displays no Babinski's sign on the left side.  Reflex Scores:      Tricep reflexes are 2+ on the right side and 2+ on the left side.      Bicep reflexes are 2+ on the right side and 2+ on the left side.      Brachioradialis reflexes are 2+ on the right side and 2+ on the left side.      Patellar reflexes are 2+ on the right side and 2+ on the left side.      Achilles reflexes are 2+ on the right side and 2+ on the left side. No noted pronator drift or weakness to the left upper extremity on my exam.  Skin: No rash noted. No pallor.  Psychiatric: He has a normal mood  and affect. His behavior is normal.  Nursing note and vitals reviewed.    ED Treatments / Results  Labs (all labs ordered are listed, but only abnormal results are displayed) Labs Reviewed  COMPREHENSIVE METABOLIC PANEL - Abnormal; Notable for the following:       Result Value   Glucose, Bld 103 (*)    All other components within normal limits  I-STAT CHEM 8, ED - Abnormal; Notable for the following:    Calcium, Ion 1.02 (*)    All other components within normal limits  PROTIME-INR  APTT  CBC  DIFFERENTIAL  I-STAT TROPOININ, ED  CBG  MONITORING, ED    EKG  EKG Interpretation  Date/Time:  Tuesday August 15 2016 18:35:54 EDT Ventricular Rate:  60 PR Interval:    QRS Duration: 96 QT Interval:  419 QTC Calculation: 419 R Axis:   42 Text Interpretation:  Sinus rhythm No significant change since last tracing Confirmed by Melene Plan [phone]) on 08/15/2016 7:10:34 PM       Radiology Ct Head Code Stroke W/o Cm  Result Date: 08/15/2016 CLINICAL DATA:  Code stroke.  Left eye visual disturbance EXAM: CT HEAD WITHOUT CONTRAST TECHNIQUE: Contiguous axial images were obtained from the base of the skull through the vertex without intravenous contrast. COMPARISON:  Brain MRI 04/17/2016 FINDINGS: Brain: No mass lesion or acute hemorrhage. No focal hypoattenuation of the basal ganglia or cortex to indicate infarcted tissue. No hydrocephalus or age advanced atrophy. Vascular: No hyperdense vessel. No advanced atherosclerotic calcification of the arteries at the skull base. Skull: Normal visualized skull base, calvarium and extracranial soft tissues. Sinuses/Orbits: No sinus fluid levels or advanced mucosal thickening. No mastoid effusion. Normal orbits. ASPECTS [facility] Stroke Program Early CT Score) - Ganglionic level infarction (caudate, lentiform nuclei, internal capsule, insula, M1-M3 cortex): 7 - Supraganglionic infarction (M4-M6 cortex): 3 Total score (0-10 with 10 being normal): 10  IMPRESSION: 1. Normal head CT. 2. ASPECTS is 10. The on call stroke neurologist was paged at 6:20 p.m. Electronically Signed   By: Deatra Robinson M.D.   On: 08/15/2016 18:22    Procedures Procedures (including critical care time)  Medications Ordered in ED Medications  iopamidol (ISOVUE-370) 76 % injection (not administered)     Initial Impression / Assessment and Plan / ED Course  I have reviewed the triage vital signs and the nursing notes.  Pertinent labs & imaging results that were available during my care of the patient were reviewed by me and considered in my medical decision making (see chart for details).     51 yo M With a chief complaints of acute onset left-sided weakness. Seen by neurology with pronator drift. Initial CT with no acute findings. Recommended TIA workup.   The patients results and plan were reviewed and discussed.   Any x-rays performed were independently reviewed by myself.   Differential diagnosis were considered with the presenting HPI.  Medications  iopamidol (ISOVUE-370) 76 % injection (not administered)   stroke: mapping our early stages of recovery book (not administered)  acetaminophen (TYLENOL) tablet 650 mg (not administered)    Or  acetaminophen (TYLENOL) solution 650 mg (not administered)    Or  acetaminophen (TYLENOL) suppository 650 mg (not administered)  senna-docusate (Senokot-S) tablet 1 tablet (not administered)  enoxaparin (LOVENOX) injection 40 mg (not administered)  ondansetron (ZOFRAN) injection 4 mg (not administered)  ALPRAZolam (XANAX) tablet 0.5 mg (not administered)  amLODipine (NORVASC) tablet 10 mg (not administered)  atorvastatin (LIPITOR) tablet 20 mg (not administered)  clopidogrel (PLAVIX) tablet 75 mg (not administered)  loratadine (CLARITIN) tablet 10 mg (not administered)  fluticasone furoate-vilanterol (BREO ELLIPTA) 200-25 MCG/INH 1 puff (not administered)  famotidine (PEPCID) tablet 20 mg (not administered)    albuterol (PROVENTIL) (2.5 MG/3ML) 0.083% nebulizer solution 2.5 mg (not administered)  triamcinolone (NASACORT) nasal inhaler 2 spray (not administered)  losartan (COZAAR) tablet 100 mg (not administered)    And  hydrochlorothiazide (MICROZIDE) capsule 12.5 mg (not administered)    Vitals:   08/15/16 2000 08/15/16 2015 08/15/16 2100 08/15/16 2130  BP: (!) 144/98 (!) 123/95 (!) 156/95 (!) 141/88  Pulse: (!) 58 61  63 (!) 57  Resp: 17 15 13 18   Temp:    98 F (36.7 C)  TempSrc:    Oral  SpO2: 98% 98% 99% 96%  Weight:    85.8 kg (189 lb 2.5 oz)  Height:    5\' 8"  (1.727 m)    Final diagnoses:  Stroke [facility])  Stroke (HCC)  TIA (transient ischemic attack)  TIA (transient ischemic attack)    Admission/ observation were discussed with the admitting physician, patient and/or family and they are comfortable with the plan.    Final Clinical Impressions(s) / ED Diagnoses   Final diagnoses:  Stroke [facility])  Stroke [facility])    New Prescriptions New Prescriptions   No medications on file     [facility], DO 08/15/16 2334

## 2016-08-15 NOTE — Code Documentation (Signed)
51 y.o. male with hx of HTN, TIA's and stroke, last one being 6 months ago with no residual symptoms presents L sided arm weakness, tingling and facial droop that started at 1630. Symptoms started to improve by the time he arrived in the ER. CT negative for acute abnormalities. ASPECTS 10. At completion of CT, pt with completely resolved symptoms. tPA not given d/t symptoms resolving. NIHSS 4. See EMR for NIHSS. ED bedside handoff with ED RN Cricket.

## 2016-08-15 NOTE — Consult Note (Signed)
Requesting Physician: Dr. Adela Lank    Chief Complaint: code stroke  History obtained from:  Patient     HPI:                                                                                                                                         Fernando Goodwin is an 51 y.o. male with hx of HTN, TIA's and stroke, last one being 6 months ago with no residual symptoms presents L sided arm weakness, tingling and facial droop that started at 1630. Symptoms started to improve by the time he arrived in the ER.   Date last known well: 08/15/16 Time last known well: 1630 tPA Given: No: Low NIHSS, rapidly improving symptoms and non disabling symptoms  NIHSS 1, L pronator drift   Past Medical History:  Diagnosis Date  . Arthritis   . Asthma   . Hypertension   . Stroke [facility])    2015    Past Surgical History:  Procedure Laterality Date  . HERNIA REPAIR      Family History  Problem Relation Age of Onset  . Cancer Mother        Breast  . Dementia Father   . Hypertension Father    Social History:  reports that he has quit smoking. He has never used smokeless tobacco. He reports that he does not drink alcohol or use drugs.  Allergies: No Known Allergies  Medications:                                                                                                                           I have reviewed the patient's current medications.  ROS:  History obtained from the patient  General ROS: negative for - chills, fatigue, fever, night sweats, weight gain or weight loss Psychological ROS: negative for - behavioral disorder, hallucinations, memory difficulties, mood swings or suicidal ideation Ophthalmic ROS: negative for - blurry vision, double vision, eye pain or loss of vision ENT ROS: negative for - epistaxis, nasal discharge, oral lesions, sore  throat, tinnitus or vertigo Allergy and Immunology ROS: negative for - hives or itchy/watery eyes Hematological and Lymphatic ROS: negative for - bleeding problems, bruising or swollen lymph nodes Endocrine ROS: negative for - galactorrhea, hair pattern changes, polydipsia/polyuria or temperature intolerance Respiratory ROS: negative for - cough, hemoptysis, shortness of breath or wheezing Cardiovascular ROS: negative for - chest pain, dyspnea on exertion, edema or irregular heartbeat Gastrointestinal ROS: negative for - abdominal pain, diarrhea, hematemesis, nausea/vomiting or stool incontinence Genito-Urinary ROS: negative for - dysuria, hematuria, incontinence or urinary frequency/urgency Musculoskeletal ROS: negative for - joint swelling or muscular weakness Neurological ROS: as noted in HPI Dermatological ROS: negative for rash and skin lesion changes  Neurologic Examination:                                                                                                      There were no vitals taken for this visit.  HEENT-  Normocephalic, no lesions, without obvious abnormality.  Normal external eye and conjunctiva.  Normal TM's bilaterally.  Normal auditory canals and external ears. Normal external nose, mucus membranes and septum.  Normal pharynx. Cardiovascular- regular rate and rhythm, S1, S2 normal, no murmur, click, rub or gallop, pulses palpable throughout   Lungs- chest clear, no wheezing, rales, normal symmetric air entry, Heart exam - S1, S2 normal, no murmur, no gallop, rate regular Abdomen- soft, non-tender; bowel sounds normal; no masses,  no organomegaly Extremities- no edema   Neurological Examination Mental Status: Alert, oriented, thought content appropriate.  Speech fluent without evidence of aphasia.  Able to follow 3 step commands without difficulty. Cranial Nerves: II: Discs flat bilaterally; Visual fields grossly normal,  III,IV, VI: ptosis not present,  extra-ocular motions intact bilaterally, pupils equal, round, reactive to light and accommodation V,VII: smile symmetric, facial light touch sensation normal bilaterally VIII: hearing normal bilaterally IX,X: uvula rises symmetrically XI: bilateral shoulder shrug XII: midline tongue extension Motor: Right : Upper extremity   5/5    Left:     Upper extremity   5/5 pronator drift  Lower extremity   5/5     Lower extremity   5/5 Tone and bulk:normal tone throughout; no atrophy noted Sensory: Pinprick and light touch intact throughout, bilaterally Cerebellar: normal finger-to-nose, normal rapid alternating movements and normal heel-to-shin test        Lab Results: Basic Metabolic Panel: No results for input(s): NA, K, CL, CO2, GLUCOSE, BUN, CREATININE, CALCIUM, MG, PHOS in the last 168 hours.  Liver Function Tests: No results for input(s): AST, ALT, ALKPHOS, BILITOT, PROT, ALBUMIN in the last 168 hours. No results for input(s): LIPASE, AMYLASE in the last 168 hours. No results for input(s): AMMONIA in the last 168 hours.  CBC: No results for input(s): WBC, NEUTROABS, HGB, HCT, MCV, PLT in the last 168 hours.  Cardiac Enzymes: No results for input(s): CKTOTAL, CKMB, CKMBINDEX, TROPONINI in the last 168 hours.  Lipid Panel: No results for input(s): CHOL, TRIG, HDL, CHOLHDL, VLDL, LDLCALC in the last 168 hours.  CBG: No results for input(s): GLUCAP in the last 168 hours.  Microbiology: Results for orders placed or performed in visit on 04/18/13  Influenza A&B Antigens [facility])     Status: None   Collection Time: 04/18/13  1:15 PM  Result Value Ref Range Status   Micro Text Report   Final       COMMENT                   NEGATIVE FOR INFLUENZA A (ANTIGEN ABSENT)   COMMENT                   NEGATIVE FOR INFLUENZA B (ANTIGEN ABSENT)   ANTIBIOTIC                                                      Culture, blood (single)     Status: None   Collection Time: 04/18/13  1:39 PM   Result Value Ref Range Status   Micro Text Report   Final       COMMENT                   NO GROWTH AEROBICALLY/ANAEROBICALLY IN 5 DAYS   ANTIBIOTIC                                                        Coagulation Studies: No results for input(s): LABPROT, INR in the last 72 hours.  Imaging: No results found.      08/15/2016, 6:10 PM   Assessment: 51 y.o. male with hx of HTN, TIA's and stroke, last one being 6 months ago with no residual symptoms presents L sided arm weakness, tingling and facial droop that started at 1630. Symptoms started to improve by the time he arrived in the ER.  NIHSS 1 for pronator drift  1. HgbA1c, fasting lipid panel 2. MRI of the brain without contrast, CTA head and neck 3. PT consult, OT consult, Speech consult 4. Echocardiogram 5. Carotid dopplers 6. Prophylactic therapy-Antiplatelet med: Aspirin - dose 325 7. Risk factor modification 8. Telemetry monitoring 9. Frequent neuro checks 10 NPO until passes stroke swallow screen 11 please page stroke NP  Or  PA  Or MD from 8am -4 pm  as this patient from this time will be  followed by the stroke.   You can look them up on [email or web address]  Password [email or web address]       Stroke Risk Factors - hypertension

## 2016-08-15 NOTE — ED Notes (Signed)
IV team consulted to CT 2

## 2016-08-15 NOTE — ED Triage Notes (Signed)
Pt arrived via [facility] from work c/o left side weakness, left eye blurry vision, left facial droop and pressure in the back of his head.  Facial droop resolved by arrival to ED.  Pt states he has had 4 strokes in 3 years and several TIA's.

## 2016-08-15 NOTE — ED Notes (Signed)
Report attempted 

## 2016-08-16 ENCOUNTER — Observation Stay (HOSPITAL_COMMUNITY): Payer: Managed Care, Other (non HMO)

## 2016-08-16 ENCOUNTER — Observation Stay (HOSPITAL_BASED_OUTPATIENT_CLINIC_OR_DEPARTMENT_OTHER): Payer: Managed Care, Other (non HMO)

## 2016-08-16 ENCOUNTER — Observation Stay (HOSPITAL_BASED_OUTPATIENT_CLINIC_OR_DEPARTMENT_OTHER)
Admit: 2016-08-16 | Discharge: 2016-08-16 | Disposition: A | Discharge status: Home / Self Care | Payer: Managed Care, Other (non HMO) | Attending: Internal Medicine | Admitting: Internal Medicine

## 2016-08-16 DIAGNOSIS — G458 Other transient cerebral ischemic attacks and related syndromes: Secondary | ICD-10-CM

## 2016-08-16 DIAGNOSIS — G459 Transient cerebral ischemic attack, unspecified: Secondary | ICD-10-CM | POA: Diagnosis not present

## 2016-08-16 DIAGNOSIS — R531 Weakness: Secondary | ICD-10-CM

## 2016-08-16 DIAGNOSIS — I10 Essential (primary) hypertension: Secondary | ICD-10-CM | POA: Diagnosis not present

## 2016-08-16 LAB — ECHOCARDIOGRAM COMPLETE
Height: 68 in
Weight: 3026.47 oz

## 2016-08-16 LAB — LIPID PANEL
Cholesterol: 156 mg/dL (ref 0–200)
HDL: 56 mg/dL (ref >40)
LDL CALC: 67 mg/dL (ref 0–99)
Total CHOL/HDL Ratio: 2.8 RATIO
Triglycerides: 166 mg/dL — ABNORMAL HIGH (ref <150)
VLDL: 33 mg/dL (ref 0–40)

## 2016-08-16 LAB — HIV ANTIBODY (ROUTINE TESTING W REFLEX): HIV SCREEN 4TH GENERATION: NONREACTIVE

## 2016-08-16 MED ORDER — ASPIRIN 81 MG PO TBEC: 81.0000 mg | DELAYED_RELEASE_TABLET | Freq: Every day | ORAL | 0 refills | Status: DC

## 2016-08-16 MED ORDER — ASPIRIN EC 81 MG PO TBEC
81.0000 mg | DELAYED_RELEASE_TABLET | Freq: Every day | ORAL | Status: DC
  Administered 2016-08-16: 81 mg via ORAL
  Filled 2016-08-16: qty 1

## 2016-08-16 NOTE — Progress Notes (Signed)
Pt being discharged per orders from MD. Pt and family educated on discharge instructions. Pt and family verbalized understanding of instructions. All questions and concerns were addressed. Pt's IV was removed prior to discharge. Pt exited hospital via ambulation.

## 2016-08-16 NOTE — Progress Notes (Signed)
  Echocardiogram 2D Echocardiogram has been performed.  Fernando Goodwin 08/16/2016, 4:02 PM

## 2016-08-16 NOTE — Progress Notes (Signed)
EEG Completed; Results Pending  

## 2016-08-16 NOTE — Progress Notes (Signed)
*  PRELIMINARY RESULTS* Vascular Ultrasound Carotid Duplex (Doppler) has been completed.   Findings suggest 1-39% internal carotid artery stenosis bilaterally. Vertebral arteries are patent with antegrade flow.  08/16/2016 3:19 PM Gertie Fey, BS, RVT, RDCS, RDMS

## 2016-08-16 NOTE — Progress Notes (Signed)
SLP Cancellation Note  Patient Details Name: Fernando Goodwin MRN: [id number] DOB: March 08, 1965   Cancelled treatment:       Reason Eval/Treat Not Completed: SLP screened, no needs identified, will sign off. MRI negative for stroke. Spoke with pt briefly at bedside. Pt feels back to baseline, no residual difficulties. Thought content appropriate. Will sign off at this time; please re-consult if needs arise.   Metro Kung, MA, CCC-SLP 08/16/2016, 11:30 AM [phone]

## 2016-08-16 NOTE — Progress Notes (Signed)
STROKE TEAM PROGRESS NOTE   HISTORY OF PRESENT ILLNESS (per record) Fernando Goodwin is an 51 y.o. male with hx of HTN, TIA's and stroke, last one being 6 months ago with no residual symptoms presents L sided arm weakness, tingling and facial droop that started at 1630 on 08/15/2016. Symptoms started to improve by the time he arrived in the ER.  The patient reports a history of anxiety and stressful working conditions.   Date last known well: 08/15/16 Time last known well: 1630  NIHSS 1, L pronator drift  Patient was not administered IV t-PA secondary to low NIHSS and minimal deficits on exam. He was admitted to General Neurology for further evaluation and treatment.   SUBJECTIVE (INTERVAL HISTORY) His fiance is at the bedside.he has no new complaints. MRI scan of the brain shows no acute infarct. He states he had a somewhat similar episode of TIA a couple of years ago and was seen in [facility] with negative workup.   OBJECTIVE Temp:  [97.9 F (36.6 C)-98.2 F (36.8 C)] 98.2 F (36.8 C) (06/27 0413) Pulse Rate:  [57-64] 59 (06/27 0413) Cardiac Rhythm: Normal sinus rhythm (06/26 2309) Resp:  [13-21] 16 (06/27 0413) BP: (103-156)/(58-98) 103/69 (06/27 0413) SpO2:  [96 %-100 %] 98 % (06/27 0413) Weight:  [85.8 kg (189 lb 2.5 oz)] 85.8 kg (189 lb 2.5 oz) (06/26 2130)  CBC:  Recent Labs Lab 08/15/16 1758 08/15/16 1807  WBC 7.6  --   NEUTROABS 4.2  --   HGB 16.1 15.6  HCT 45.8 46.0  MCV 88.4  --   PLT 229  --     Basic Metabolic Panel:  Recent Labs Lab 08/15/16 1758 08/15/16 1807  NA 137 137  K 3.7 3.7  CL 101 101  CO2 27  --   GLUCOSE 103* 94  BUN 11 15  CREATININE 1.02 0.90  CALCIUM 9.1  --     Lipid Panel:    Component Value Date/Time   CHOL 156 08/16/2016 0709   TRIG 166 (H) 08/16/2016 0709   HDL 56 08/16/2016 0709   CHOLHDL 2.8 08/16/2016 0709   VLDL 33 08/16/2016 0709   LDLCALC 67 08/16/2016 0709   HgbA1c:  Lab Results   Component Value Date   HGBA1C 5.5 01/23/2014   Urine Drug Screen:    Component Value Date/Time   LABOPIA NONE DETECTED 01/22/2014 1543   COCAINSCRNUR NONE DETECTED 01/22/2014 1543   LABBENZ NONE DETECTED 01/22/2014 1543   AMPHETMU NONE DETECTED 01/22/2014 1543   THCU NONE DETECTED 01/22/2014 1543   LABBARB NONE DETECTED 01/22/2014 1543    Alcohol Level     Component Value Date/Time   [facility] <11 01/22/2014 1512    IMAGING  Mr Brain Wo Contrast Mr Maxine Glenn Head/brain Wo Cm 08/16/2016 IMPRESSION: 1. No acute intracranial abnormality. 2. Mild paranasal sinus disease. 3. Otherwise unremarkable stable MRI of the brain. 4. Stable normal MRA of the head.  Ct Head Code Stroke W/o Cm 08/15/2016 IMPRESSION: 1. Normal head CT. 2. ASPECTS is 10.      PHYSICAL EXAM Obese middle-aged Caucasian male not in distress.  . Afebrile. Head is nontraumatic. Neck is supple without bruit.    Cardiac exam no murmur or gallop. Lungs are clear to auscultation. Distal pulses are well felt. Neurological Exam ;  Awake  Alert oriented x 3. Normal speech and language.eye movements full without nystagmus.fundi were not visualized. Vision acuity and fields appear normal. Hearing is normal. Palatal movements  are normal. Face symmetric. Tongue midline. Normal strength, tone, reflexes and coordination. Subjective diminished left hemibody sensation with splitting of midline and fore head. sensation. Gait deferred.  ASSESSMENT/PLAN Mr. Fernando Goodwin is a 51 y.o. male with history of HTN, TIA's and stroke, last one being 6 months ago with no residual symptoms  presenting with L sided arm weakness, tingling and facial droop that started at 1630 on 08/15/2016. He did not receive IV t-PA due to low NIHSS and minimal deficits on exam.   TIA:  Symptoms resolved without identifiable lesion  Resultant    left hemibody sensory loss   CT head: no stroke  MRI head: no stroke  MRA head: no stroke  Carotid Doppler: pending     2D Echo  Pending    LDL 67  HgbA1c pending   SCSs for VTE prophylaxis  Diet Heart Room service appropriate? Yes; Fluid consistency: Thin  clopidogrel 75 mg daily prior to admission, now on aspirin 81 mg daily and clopidogrel 75 mg daily  Patient counseled to be compliant with his antithrombotic medications  Ongoing aggressive stroke risk factor management  Therapy recommendations:  none  Disposition:  Discharge to home/self-care  Hypertension  Stable  Permissive hypertension (OK if < 220/120) but gradually normalize in 5-7 days  Long-term BP goal normotensive  Hyperlipidemia  Home meds:  Atorvastatin 20mg , resumed in hospital  LDL 67, goal < 70  Continue statin at discharge  Other Stroke Risk Factors  ETOH use, advised to drink no more than 2 drink(s) a day  Overweight, Body mass index is 28.76 kg/m., recommend weight loss, diet and exercise as appropriate   Hx stroke/TIA  Other Active Problems  none  Hospital day # 0  I have personally examined this patient, reviewed notes, independently viewed imaging studies, participated in medical decision making and plan of care.ROS completed by me personally and pertinent positives fully documented  I have made any additions or clarifications directly to the above note.  To he presented with transient left-sided weakness and sensory loss possibly from a right brain TIA. He has multiple vascular risk factors. Recommend aspirin and Plavix for one month followed by Plavix alone. Continue ongoing stroke workup. Discussed with patient and girlfriend and answered questions.Greater than 50% time during this 35 minute visit was spent on counseling and coordination of care about TIA evaluation, prevention and treatment discussion. Patient may also possibly consider participation in [employer] stroke prevention trials is interested.  , MD Medical Director [facility] Pager: [phone] 08/16/2016 8:27  PM   To contact Stroke Continuity provider, please refer to 08/18/2016. After hours, contact General Neurology

## 2016-08-16 NOTE — Procedures (Signed)
ELECTROENCEPHALOGRAM REPORT  Date of Study: 08/16/2016  Patient's Name: Fernando Goodwin MRN: [id number] Date of Birth: 01/06/1966  Referring Provider: Dr. Lyda Perone  Clinical History: This is a 51 year old man with transient left-sided weakness, tingling.  Medications: acetaminophen (TYLENOL) tablet 650 mg  albuterol (PROVENTIL) (2.5 MG/3ML) 0.083% nebulizer solution 2.5 mg  ALPRAZolam (XANAX) tablet 0.5 mg  amLODipine (NORVASC) tablet 10 mg  atorvastatin (LIPITOR) tablet 20 mg  clopidogrel (PLAVIX) tablet 75 mg  enoxaparin (LOVENOX) injection 40 mg  famotidine (PEPCID) tablet 20 mg  fluticasone furoate-vilanterol (BREO ELLIPTA) 200-25 MCG/INH 1 puff  hydrochlorothiazide (MICROZIDE) capsule 12.5 mg  loratadine (CLARITIN) tablet 10 mg  losartan (COZAAR) tablet 100 mg  ondansetron (ZOFRAN) injection 4 mg  senna-docusate (Senokot-S) tablet 1 tablet  triamcinolone (NASACORT) nasal inhaler 2 spray   Technical Summary: A multichannel digital EEG recording measured by the international 10-20 system with electrodes applied with paste and impedances below 5000 ohms performed in our laboratory with EKG monitoring in an awake and asleep patient.  Hyperventilation and photic stimulation were not performed.  The digital EEG was referentially recorded, reformatted, and digitally filtered in a variety of bipolar and referential montages for optimal display.    Description: The patient is awake and asleep during the recording.  During maximal wakefulness, there is a symmetric, medium voltage 9 Hz posterior dominant rhythm that attenuates with eye opening.  The record is symmetric.  During drowsiness and sleep, there is an increase in theta slowing of the background.  Vertex waves and symmetric sleep spindles were seen.  Hyperventilation and photic stimulation were not performed.  There were no epileptiform discharges or electrographic seizures seen.    EKG lead was  unremarkable.  Impression: This awake and asleep EEG is normal.    Clinical Correlation: A normal EEG does not exclude a clinical diagnosis of epilepsy. Clinical correlation is advised.   Patrcia Dolly, M.D.

## 2016-08-16 NOTE — Progress Notes (Signed)
PT Cancellation Note  Patient Details Name: Fernando Goodwin MRN: [id number] DOB: 09/26/1965   Cancelled Treatment:    Reason Eval/Treat Not Completed: PT screened, no needs identified, will sign off.  I spoke with pt's significant other and she stated that he is back to his baseline level of mobility.  He no longer has the deficits for which he was admitted and MDs are thinking it is a TIA.  I explained the purpose of acute PT and she did not think he needed my assessment.  She has not noticed any balance deficits as he has moved back and forth to the bathroom.  Pt is away at a test currently.  PT to sign off. Please re-consult if needed.  Thanks,    Rollene Rotunda. Ardelle Haliburton, PT, DPT [phone]   08/16/2016, 3:20 PM

## 2016-08-16 NOTE — Progress Notes (Signed)
OT Cancellation Note and Discharge  Patient Details Name: Fernando Goodwin MRN: [id number] DOB: [birth year]   Cancelled Treatment:    Reason Eval/Treat Not Completed: OT screened, no needs identified, will sign off.  Evern Bio Hildreth Robart 08/16/2016, 3:19 PM  Sherryl Manges OTR/L [phone]

## 2016-08-16 NOTE — Discharge Summary (Signed)
Physician Discharge Summary  Fernando Goodwin ZDG:[id number] DOB: 12/26/1965 DOA: 08/15/2016  PCP: Barbette Reichmann, MD  Admit date: 08/15/2016 Discharge date: 08/16/2016  Admitted From: home Disposition:  home       Discharge Condition:  stable   CODE STATUS:  Full code   Consultations:  Neurohospitalists & Stroke team    Discharge Diagnoses:  Principal Problem:   TIA (transient ischemic attack) Active Problems:   Essential hypertension   Subjective: Left arm weakness and facial droop have resolved. He has had no other symptoms.   Brief Summary: Fernando Goodwin is a 51 y.o. male with medical history significant of HTN, TIA x3 (2015, 2017, and Feb of 2018).  All of his Prior TIAs (and todays) have been hemispheric symptoms with associated L sided weakness.  Today he developed L sided arm weakness, facial droop.  Symptoms onset at 1630 & improved by time he arrived in ED.  Hospital Course:  TIA - underwent MRI/ MRA which are unrevealing- see report below - he is already taking Plavix and a statin - evaluated by neurohospitalists and then stroke team- I  Dr Pearlean Brownie recommends that the patient  take an 81 mg Aspirin in addition to Plavix for a total of 30 days - EEG done today is normal  HTN - cont home meds   Discharge Instructions  Discharge Instructions    Diet - low sodium heart healthy    Complete by:  As directed    Increase activity slowly    Complete by:  As directed      Allergies as of 08/16/2016   No Known Allergies     Medication List    TAKE these medications   acetaminophen 325 MG tablet Commonly known as:  TYLENOL Take 2 tablets (650 mg total) by mouth every 6 (six) hours as needed for mild pain.   ALPRAZolam 0.5 MG tablet Commonly known as:  XANAX Take 0.5 mg by mouth at bedtime as needed for anxiety.   amLODipine 10 MG tablet Commonly known as:  NORVASC Take 10 mg by mouth daily. What changed:  Another medication with the same name was  removed. Continue taking this medication, and follow the directions you see here.   aspirin 81 MG EC tablet Take 1 tablet (81 mg total) by mouth daily. For 30 days only Start taking on:  08/17/2016   atorvastatin 20 MG tablet Commonly known as:  LIPITOR Take 1 tablet (20 mg total) by mouth daily at 6 PM.   Cetirizine HCl 10 MG Caps Take 10 mg by mouth daily.   clopidogrel 75 MG tablet Commonly known as:  PLAVIX Take 1 tablet (75 mg total) by mouth daily.   COQ10 PO Take 1 capsule by mouth daily.   famotidine 20 MG tablet Commonly known as:  PEPCID Take 20 mg by mouth daily.   Fluticasone-Salmeterol 250-50 MCG/DOSE Aepb Commonly known as:  ADVAIR Inhale 1 puff into the lungs 2 (two) times daily.   losartan-hydrochlorothiazide 100-12.5 MG tablet Commonly known as:  HYZAAR Take 1 tablet by mouth daily.   NASACORT ALLERGY 24HR 55 MCG/ACT Aero nasal inhaler Generic drug:  triamcinolone Inhale 2 sprays into the lungs daily as needed (for congestion).   nitroGLYCERIN 0.4 MG SL tablet Commonly known as:  NITROSTAT Place 1 tablet (0.4 mg total) under the tongue every 5 (five) minutes as needed for chest pain.   PROAIR HFA 108 (90 Base) MCG/ACT inhaler Generic drug:  albuterol Inhale 2 puffs into the  lungs every 6 (six) hours as needed.       No Known Allergies   Procedures/Studies: EEG  Mr Brain Wo Contrast  Result Date: 08/16/2016 CLINICAL DATA:  50 y/o M; history of multiple TIAs. Patient presents with transient left-sided weakness. EXAM: MRI HEAD WITHOUT CONTRAST MRA HEAD WITHOUT CONTRAST TECHNIQUE: Multiplanar, multiecho pulse sequences of the brain and surrounding structures were obtained without intravenous contrast. Angiographic images of the head were obtained using MRA technique without contrast. COMPARISON:  08/15/2016 CT of the head. 04/17/2016 MRI of the brain. 01/23/2014 MRA of the head. FINDINGS: MRI HEAD FINDINGS Brain: No acute infarction, hemorrhage,  hydrocephalus, extra-axial collection or mass lesion. Vascular: As below. Skull and upper cervical spine: Normal marrow signal. Sinuses/Orbits: Mild diffuse paranasal sinus mucosal thickening. No abnormal signal of mastoid air cells. Orbits are unremarkable. Other: None. MRA HEAD FINDINGS Internal carotid arteries:  Patent. Anterior cerebral arteries:  Patent. Middle cerebral arteries: Patent. Anterior communicating artery: Patent. Posterior communicating arteries: Patent. Large right P com with small right P1 segment consistent with fetal PCA. Posterior cerebral arteries:  Patent. Basilar artery:  Patent. Vertebral arteries: Patent. Right vertebral artery terminates in right PICA. No evidence of high-grade stenosis, large vessel occlusion, or aneurysm. IMPRESSION: 1. No acute intracranial abnormality. 2. Mild paranasal sinus disease. 3. Otherwise unremarkable stable MRI of the brain. 4. Stable normal MRA of the head. Electronically Signed   By: Mitzi Hansen M.D.   On: 08/16/2016 05:51   Mr Maxine Glenn Head/brain [place] Cm  Result Date: 08/16/2016 CLINICAL DATA:  51 y/o M; history of multiple TIAs. Patient presents with transient left-sided weakness. EXAM: MRI HEAD WITHOUT CONTRAST MRA HEAD WITHOUT CONTRAST TECHNIQUE: Multiplanar, multiecho pulse sequences of the brain and surrounding structures were obtained without intravenous contrast. Angiographic images of the head were obtained using MRA technique without contrast. COMPARISON:  08/15/2016 CT of the head. 04/17/2016 MRI of the brain. 01/23/2014 MRA of the head. FINDINGS: MRI HEAD FINDINGS Brain: No acute infarction, hemorrhage, hydrocephalus, extra-axial collection or mass lesion. Vascular: As below. Skull and upper cervical spine: Normal marrow signal. Sinuses/Orbits: Mild diffuse paranasal sinus mucosal thickening. No abnormal signal of mastoid air cells. Orbits are unremarkable. Other: None. MRA HEAD FINDINGS Internal carotid arteries:  Patent. Anterior  cerebral arteries:  Patent. Middle cerebral arteries: Patent. Anterior communicating artery: Patent. Posterior communicating arteries: Patent. Large right P com with small right P1 segment consistent with fetal PCA. Posterior cerebral arteries:  Patent. Basilar artery:  Patent. Vertebral arteries: Patent. Right vertebral artery terminates in right PICA. No evidence of high-grade stenosis, large vessel occlusion, or aneurysm. IMPRESSION: 1. No acute intracranial abnormality. 2. Mild paranasal sinus disease. 3. Otherwise unremarkable stable MRI of the brain. 4. Stable normal MRA of the head. Electronically Signed   By: Mitzi Hansen M.D.   On: 08/16/2016 05:51   Ct Head Code Stroke W/o Cm  Result Date: 08/15/2016 CLINICAL DATA:  Code stroke.  Left eye visual disturbance EXAM: CT HEAD WITHOUT CONTRAST TECHNIQUE: Contiguous axial images were obtained from the base of the skull through the vertex without intravenous contrast. COMPARISON:  Brain MRI 04/17/2016 FINDINGS: Brain: No mass lesion or acute hemorrhage. No focal hypoattenuation of the basal ganglia or cortex to indicate infarcted tissue. No hydrocephalus or age advanced atrophy. Vascular: No hyperdense vessel. No advanced atherosclerotic calcification of the arteries at the skull base. Skull: Normal visualized skull base, calvarium and extracranial soft tissues. Sinuses/Orbits: No sinus fluid levels or advanced mucosal thickening. No mastoid effusion.  Normal orbits. ASPECTS [facility] Stroke Program Early CT Score) - Ganglionic level infarction (caudate, lentiform nuclei, internal capsule, insula, M1-M3 cortex): 7 - Supraganglionic infarction (M4-M6 cortex): 3 Total score (0-10 with 10 being normal): 10 IMPRESSION: 1. Normal head CT. 2. ASPECTS is 10. The on call stroke neurologist was paged at 6:20 p.m. Electronically Signed   By: Deatra Robinson M.D.   On: 08/15/2016 18:22        Discharge Exam: Vitals:   08/16/16 0413 08/16/16 1048  BP:  103/69 120/73  Pulse: (!) 59 70  Resp: 16 17  Temp: 98.2 F (36.8 C) 98.2 F (36.8 C)   Vitals:   08/15/16 2130 08/16/16 0125 08/16/16 0413 08/16/16 1048  BP: (!) 141/88 (!) 104/58 103/69 120/73  Pulse: (!) 57 63 (!) 59 70  Resp: 18 18 16 17   Temp: 98 F (36.7 C) 97.9 F (36.6 C) 98.2 F (36.8 C) 98.2 F (36.8 C)  TempSrc: Oral Oral Oral Oral  SpO2: 96% 98% 98% 97%  Weight: 85.8 kg (189 lb 2.5 oz)     Height: 5\' 8"  (1.727 m)       General: Pt is alert, awake, not in acute distress Cardiovascular: RRR, S1/S2 +, no rubs, no gallops Respiratory: CTA bilaterally, no wheezing, no rhonchi Abdominal: Soft, NT, ND, bowel sounds + Extremities: no edema, no cyanosis    The results of significant diagnostics from this hospitalization (including imaging, microbiology, ancillary and laboratory) are listed below for reference.     Microbiology: No results found for this or any previous visit (from the past 240 hour(s)).   Labs: BNP (last 3 results) No results for input(s): BNP in the last 8760 hours. Basic Metabolic Panel:  Recent Labs Lab 08/15/16 1758 08/15/16 1807  NA 137 137  K 3.7 3.7  CL 101 101  CO2 27  --   GLUCOSE 103* 94  BUN 11 15  CREATININE 1.02 0.90  CALCIUM 9.1  --    Liver Function Tests:  Recent Labs Lab 08/15/16 1758  AST 27  ALT 27  ALKPHOS 55  BILITOT 1.0  PROT 7.0  ALBUMIN 4.6   No results for input(s): LIPASE, AMYLASE in the last 168 hours. No results for input(s): AMMONIA in the last 168 hours. CBC:  Recent Labs Lab 08/15/16 1758 08/15/16 1807  WBC 7.6  --   NEUTROABS 4.2  --   HGB 16.1 15.6  HCT 45.8 46.0  MCV 88.4  --   PLT 229  --    Cardiac Enzymes: No results for input(s): CKTOTAL, CKMB, CKMBINDEX, TROPONINI in the last 168 hours. BNP: Invalid input(s): POCBNP CBG:  Recent Labs Lab 08/15/16 1934  GLUCAP 91   D-Dimer No results for input(s): DDIMER in the last 72 hours. Hgb A1c No results for input(s): HGBA1C  in the last 72 hours. Lipid Profile  Recent Labs  08/16/16 0709  CHOL 156  HDL 56  LDLCALC 67  TRIG 166*  CHOLHDL 2.8   Thyroid function studies No results for input(s): TSH, T4TOTAL, T3FREE, THYROIDAB in the last 72 hours.  Invalid input(s): FREET3 Anemia work up No results for input(s): VITAMINB12, FOLATE, FERRITIN, TIBC, IRON, RETICCTPCT in the last 72 hours. Urinalysis    Component Value Date/Time   COLORURINE YELLOW 01/22/2014 1545   APPEARANCEUR CLEAR 01/22/2014 1545   LABSPEC 1.009 01/22/2014 1545   PHURINE 6.5 01/22/2014 1545   GLUCOSEU NEGATIVE 01/22/2014 1545   HGBUR NEGATIVE 01/22/2014 1545   BILIRUBINUR NEGATIVE 01/22/2014 1545  KETONESUR NEGATIVE 01/22/2014 1545   PROTEINUR NEGATIVE 01/22/2014 1545   UROBILINOGEN 0.2 01/22/2014 1545   NITRITE NEGATIVE 01/22/2014 1545   LEUKOCYTESUR NEGATIVE 01/22/2014 1545   Sepsis Labs Invalid input(s): PROCALCITONIN,  WBC,  LACTICIDVEN Microbiology No results found for this or any previous visit (from the past 240 hour(s)).   Time coordinating discharge: Over 30 minutes  SIGNED:   Calvert Cantor, MD  [facility] 08/16/2016, 5:16 PM Pager   If 7PM-7AM, please contact night-coverage [email or web address] Password [email or web address]

## 2016-08-17 LAB — VAS US CAROTID
LCCADSYS: -113 cm/s
LCCAPDIAS: 24 cm/s
LCCAPSYS: 133 cm/s
LEFT ECA DIAS: -17 cm/s
LEFT VERTEBRAL DIAS: 19 cm/s
LICADDIAS: -25 cm/s
LICAPDIAS: -18 cm/s
LICAPSYS: -60 cm/s
Left CCA dist dias: -26 cm/s
Left ICA dist sys: -64 cm/s
RCCAPSYS: 112 cm/s
RIGHT ECA DIAS: -14 cm/s
RIGHT VERTEBRAL DIAS: 8 cm/s
Right CCA prox dias: 22 cm/s
Right cca dist sys: -79 cm/s

## 2016-08-17 LAB — HEMOGLOBIN A1C
Hgb A1c MFr Bld: 5.2 % (ref 4.8–5.6)
Mean Plasma Glucose: 103 mg/dL

## 2019-08-13 ENCOUNTER — Other Ambulatory Visit: Payer: Self-pay | Admitting: Internal Medicine

## 2019-08-13 DIAGNOSIS — M545 Low back pain, unspecified: Secondary | ICD-10-CM

## 2019-09-15 ENCOUNTER — Ambulatory Visit: Admission: RE | Admit: 2019-09-15 | Payer: No Typology Code available for payment source | Source: Ambulatory Visit

## 2020-09-03 ENCOUNTER — Other Ambulatory Visit: Payer: Self-pay | Admitting: Internal Medicine

## 2020-09-03 DIAGNOSIS — M25511 Pain in right shoulder: Secondary | ICD-10-CM

## 2020-09-14 ENCOUNTER — Other Ambulatory Visit: Payer: Self-pay

## 2020-09-14 ENCOUNTER — Other Ambulatory Visit: Payer: Self-pay | Admitting: Internal Medicine

## 2020-09-14 ENCOUNTER — Ambulatory Visit
Admission: RE | Admit: 2020-09-14 | Discharge: 2020-09-14 | Disposition: A | Discharge status: Home / Self Care | Payer: PRIVATE HEALTH INSURANCE | Source: Ambulatory Visit | Attending: Internal Medicine | Admitting: Internal Medicine

## 2020-09-14 DIAGNOSIS — M25511 Pain in right shoulder: Secondary | ICD-10-CM | POA: Insufficient documentation

## 2020-09-14 MED ORDER — GADOBUTROL 1 MMOL/ML IV SOLN: 7.5000 mL | Freq: Once | INTRAVENOUS | Status: DC | PRN

## 2020-10-25 ENCOUNTER — Emergency Department
Admission: EM | Admit: 2020-10-25 | Discharge: 2020-10-25 | Disposition: A | Discharge status: Home / Self Care | Payer: PRIVATE HEALTH INSURANCE | Attending: Student in an Organized Health Care Education/Training Program | Admitting: Student in an Organized Health Care Education/Training Program

## 2020-10-25 ENCOUNTER — Emergency Department: Payer: PRIVATE HEALTH INSURANCE

## 2020-10-25 ENCOUNTER — Other Ambulatory Visit: Payer: Self-pay

## 2020-10-25 DIAGNOSIS — I10 Essential (primary) hypertension: Secondary | ICD-10-CM | POA: Diagnosis not present

## 2020-10-25 DIAGNOSIS — S99911A Unspecified injury of right ankle, initial encounter: Secondary | ICD-10-CM | POA: Diagnosis present

## 2020-10-25 DIAGNOSIS — W11XXXA Fall on and from ladder, initial encounter: Secondary | ICD-10-CM | POA: Insufficient documentation

## 2020-10-25 DIAGNOSIS — S92011A Displaced fracture of body of right calcaneus, initial encounter for closed fracture: Secondary | ICD-10-CM | POA: Diagnosis not present

## 2020-10-25 DIAGNOSIS — M25571 Pain in right ankle and joints of right foot: Secondary | ICD-10-CM | POA: Diagnosis not present

## 2020-10-25 DIAGNOSIS — Z79899 Other long term (current) drug therapy: Secondary | ICD-10-CM | POA: Diagnosis not present

## 2020-10-25 DIAGNOSIS — Z7982 Long term (current) use of aspirin: Secondary | ICD-10-CM | POA: Diagnosis not present

## 2020-10-25 DIAGNOSIS — S92001A Unspecified fracture of right calcaneus, initial encounter for closed fracture: Secondary | ICD-10-CM

## 2020-10-25 DIAGNOSIS — Z7951 Long term (current) use of inhaled steroids: Secondary | ICD-10-CM | POA: Diagnosis not present

## 2020-10-25 DIAGNOSIS — J45909 Unspecified asthma, uncomplicated: Secondary | ICD-10-CM | POA: Insufficient documentation

## 2020-10-25 MED ORDER — HYDROMORPHONE HCL 1 MG/ML IJ SOLN
1.0000 mg | Freq: Once | INTRAMUSCULAR | Status: AC
  Administered 2020-10-25: 1 mg via SUBCUTANEOUS
  Filled 2020-10-25: qty 1

## 2020-10-25 MED ORDER — ONDANSETRON HCL 4 MG PO TABS: 4.0000 mg | ORAL_TABLET | Freq: Every day | ORAL | 0 refills | Status: DC | PRN

## 2020-10-25 MED ORDER — OXYCODONE-ACETAMINOPHEN 5-325 MG PO TABS: 1.0000 | ORAL_TABLET | ORAL | 0 refills | Status: DC | PRN

## 2020-10-25 MED ORDER — OXYCODONE-ACETAMINOPHEN 5-325 MG PO TABS
1.0000 | ORAL_TABLET | Freq: Once | ORAL | Status: AC
Start: 2020-10-25 — End: 2020-10-25
  Administered 2020-10-25: 1 via ORAL
  Filled 2020-10-25: qty 1

## 2020-10-25 NOTE — ED Provider Notes (Signed)
[facility] Emergency Department Provider Note    Event Date/Time   First MD Initiated Contact with Patient 10/25/20 1323     (approximate)  I have reviewed the triage vital signs and the nursing notes.   HISTORY  Chief Complaint Ankle Pain    HPI Fernando Goodwin is a 55 y.o. male below listed past medical history presents to the ER for evaluation of acute right ankle pain that occurred while the patient was wearing crocs coming down a ladder slipped and fell roughly 3 feet states that his ankle slipped and the crocs.  Denies any other injury.  No back pain.  No head injury.  Is complaining of moderate to severe right ankle pain unable to bear weight.  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 Authorizing Provider  acetaminophen (TYLENOL) 325 MG tablet Take 2 tablets (650 mg total) by mouth every 6 (six) hours as needed for mild pain. 01/25/14   Rinehuls, Kinnie Scales, PA-C  ALPRAZolam Prudy Feeler) 0.5 MG tablet Take 0.5 mg by mouth at bedtime as needed for anxiety.    [provider]  amLODipine (NORVASC) 10 MG tablet Take 10 mg by mouth daily. 02/09/14 02/09/15  [provider]  aspirin EC 81 MG EC tablet Take 1 tablet (81 mg total) by mouth daily. For 30 days only 08/17/16   Calvert Cantor, MD  atorvastatin (LIPITOR) 20  MG tablet Take 1 tablet (20 mg total) by mouth daily at 6 PM. 01/25/14   Rinehuls, Kinnie Scales, PA-C  Cetirizine HCl 10 MG CAPS Take 10 mg by mouth daily.    [provider]  clopidogrel (PLAVIX) 75 MG tablet Take 1 tablet (75 mg total) by mouth daily. 01/25/14   Rinehuls, Kinnie Scales, PA-C  Coenzyme Q10 (COQ10 PO) Take 1 capsule by mouth daily.    [provider]  famotidine (PEPCID) 20 MG tablet Take 20 mg by mouth daily.     [provider]  Fluticasone-Salmeterol (ADVAIR) 250-50 MCG/DOSE AEPB Inhale 1 puff into the lungs 2 (two) times daily.    [provider]  losartan-hydrochlorothiazide (HYZAAR) 100-12.5 MG tablet Take 1 tablet by mouth daily.    [provider]  NASACORT ALLERGY 24HR 55 MCG/ACT AERO nasal inhaler Inhale 2 sprays into the lungs daily as needed (for congestion).  01/12/14   [provider]  nitroGLYCERIN (NITROSTAT) 0.4 MG SL tablet Place 1 tablet (0.4 mg total) under the tongue every 5 (five) minutes as needed for chest pain. 08/06/15   Shaune Pollack, MD  ondansetron (ZOFRAN) 4 MG tablet Take 1 tablet (4 mg total) by mouth daily as needed. 10/25/20 10/25/21 Yes Willy Eddy, MD  oxyCODONE-acetaminophen (PERCOCET) 5-325 MG tablet  Take 1 tablet by mouth every 4 (four) hours as needed for severe pain. 10/25/20 10/25/21 Yes Willy Eddy, MD  PROAIR HFA 108 [phone] BASE) MCG/ACT inhaler Inhale 2 puffs into the lungs every 6 (six) hours as needed. 12/26/13   [provider]    Allergies Patient has no known allergies.    Social History Social History   Tobacco Use   Smoking status: Former   Smokeless tobacco: Never  Substance Use Topics   Alcohol use: Yes    Alcohol/week: 0.0 standard drinks    Comment: Fairly heavy at times   Drug use: No    Review of Systems Patient denies headaches, rhinorrhea, blurry vision, numbness, shortness of breath, chest pain, edema, cough, abdominal pain, nausea, vomiting, diarrhea, dysuria,  fevers, rashes or hallucinations unless otherwise stated above in HPI. ____________________________________________   PHYSICAL EXAM:  VITAL SIGNS: Vitals:   10/25/20 1311  BP: (!) 118/99  Pulse: 84  Resp: 20  Temp: 98.6 F (37 C)  SpO2: 98%    Constitutional: Alert and oriented.  Eyes: Conjunctivae are normal.  Head: Atraumatic. Nose: No congestion/rhinnorhea. Mouth/Throat: Mucous membranes are moist.   Neck: No stridor. Painless ROM.  Cardiovascular: Normal rate, regular rhythm. Grossly normal heart sounds.  Good peripheral circulation. Respiratory: Normal respiratory effort.  No retractions. Lungs CTAB. Gastrointestinal: Soft and nontender. No distention. No abdominal bruits. No CVA tenderness. Genitourinary:  Musculoskeletal: No lower extremity tenderness nor edema.  No joint effusions. Neurologic:  Normal speech and language. No gross focal neurologic deficits are appreciated. No facial droop Skin:  Skin is warm, dry and intact. No rash noted. Psychiatric: Mood and affect are normal. Speech and behavior are normal.  ____________________________________________   LABS (all labs ordered are listed, but only abnormal results are displayed)  No results found for this or any previous visit (from the past 24 hour(s)). ____________________________________________ ____________________________________________  RADIOLOGY  I personally reviewed all radiographic images ordered to evaluate for the above acute complaints and reviewed radiology reports and findings.  These findings were personally discussed with the patient.  Please see medical record for radiology report.  ____________________________________________   PROCEDURES  Procedure(s) performed:  .Ortho Injury Treatment  Date/Time: 10/25/2020 2:19 PM Performed by: Willy Eddy, MD Authorized by: Willy Eddy, MD   Consent:    Consent obtained:  VerbalPre-procedure neurovascular assessment:  neurovascularly intact Pre-procedure range of motion: reduced Immobilization: splint Splint type: short leg Splint Applied by: ED Provider Supplies used: Ortho-Glass Post-procedure neurovascular assessment: post-procedure neurovascularly intact Post-procedure range of motion: unchanged      Critical Care performed: no ____________________________________________   INITIAL IMPRESSION / ASSESSMENT AND PLAN / ED COURSE  Pertinent labs & imaging results that were available during my care of the patient were reviewed by me and considered in my medical decision making (see chart for details).   DDX: fracture, contusion, dislocation  Fernando Goodwin is a 55 y.o. who presents to the ED with acute right ankle pain as described above.  No obvious deformity.  Neurovascular intact distally.  Will order x-ray.  Will give pain medication.  No other associated injury or pain.  Clinical Course as of 10/25/20 1448  Mon Oct 25, 2020  1346 Patient with evidence of right calcaneal fracture by my review of xr.  Will consult podiatry. [PR]  1446 Patient discussed with Dr. Lilian Kapur of podiatry.  Will see in clinic this week.  Discussed return precautions.  Patient agreeable to plan. [PR]    Clinical Course User  Index [PR] Willy Eddy, MD    The patient was evaluated in Emergency Department today for the symptoms described in the history of present illness. He/she was evaluated in the context of the global COVID-19 pandemic, which necessitated consideration that the patient might be at risk for infection with the SARS-CoV-2 virus that causes COVID-19. Institutional protocols and algorithms that pertain to the evaluation of patients at risk for COVID-19 are in a state of rapid change based on information released by regulatory bodies including the CDC and federal and state organizations. These policies and algorithms were followed during the patient's care in the ED.  As part of my medical decision  making, I reviewed the following data within the electronic MEDICAL RECORD NUMBER Nursing notes reviewed and incorporated, Labs reviewed, notes from prior ED visits and [place] Controlled Substance Database   ____________________________________________   FINAL CLINICAL IMPRESSION(S) / ED DIAGNOSES  Final diagnoses:  Closed displaced fracture of right calcaneus, unspecified portion of calcaneus, initial encounter      NEW MEDICATIONS STARTED DURING THIS VISIT:  New Prescriptions   ONDANSETRON (ZOFRAN) 4 MG TABLET    Take 1 tablet (4 mg total) by mouth daily as needed.   OXYCODONE-ACETAMINOPHEN (PERCOCET) 5-325 MG TABLET    Take 1 tablet by mouth every 4 (four) hours as needed for severe pain.     Note:  This document was prepared using Dragon voice recognition software and may include unintentional dictation errors.    Willy Eddy, MD 10/25/20 [phone]

## 2020-10-25 NOTE — ED Notes (Signed)
See triage note  Presents with injury to right ankle  States he stepped down wrong from ladder  Good pulses

## 2020-10-25 NOTE — ED Triage Notes (Signed)
Pt to ED for right ankle injury today after stepping off ladder 3 feet. Ankle wrapped at this time.

## 2020-10-26 ENCOUNTER — Ambulatory Visit: Payer: Managed Care, Other (non HMO) | Admitting: Podiatry

## 2020-10-27 ENCOUNTER — Telehealth: Payer: Self-pay | Admitting: Urology

## 2020-10-27 ENCOUNTER — Ambulatory Visit: Payer: Managed Care, Other (non HMO) | Admitting: Podiatry

## 2020-10-27 NOTE — Telephone Encounter (Signed)
DOS - 11/01/20  OPEN TREATMENT CALCANEAL FX RIGHT --- [phone]  [facility] EFFECTIVE DATE - 08/07/17  SPOKE WITH KIM WITH [facility] AND SHE STATED THAT FOR CPT CODE 46286 HAS BEEN APPROVED AUTH # [id number], GOOD FROM 11/01/20 - 12/01/20.  REF # [id number] KIM M. 10/27/20 @ 11:50 AM

## 2020-10-28 ENCOUNTER — Encounter: Payer: Self-pay | Admitting: Podiatry

## 2020-10-28 ENCOUNTER — Other Ambulatory Visit: Payer: Self-pay

## 2020-10-28 ENCOUNTER — Ambulatory Visit (INDEPENDENT_AMBULATORY_CARE_PROVIDER_SITE_OTHER): Payer: PRIVATE HEALTH INSURANCE | Admitting: Podiatry

## 2020-10-28 ENCOUNTER — Ambulatory Visit (INDEPENDENT_AMBULATORY_CARE_PROVIDER_SITE_OTHER): Payer: PRIVATE HEALTH INSURANCE

## 2020-10-28 DIAGNOSIS — S92011A Displaced fracture of body of right calcaneus, initial encounter for closed fracture: Secondary | ICD-10-CM | POA: Diagnosis not present

## 2020-10-28 DIAGNOSIS — M79671 Pain in right foot: Secondary | ICD-10-CM

## 2020-11-01 ENCOUNTER — Other Ambulatory Visit: Payer: Self-pay | Admitting: Podiatry

## 2020-11-01 DIAGNOSIS — S92001A Unspecified fracture of right calcaneus, initial encounter for closed fracture: Secondary | ICD-10-CM | POA: Diagnosis not present

## 2020-11-01 MED ORDER — IBUPROFEN 800 MG PO TABS: 800.0000 mg | ORAL_TABLET | Freq: Four times a day (QID) | ORAL | 1 refills | Status: DC | PRN

## 2020-11-01 MED ORDER — OXYCODONE-ACETAMINOPHEN 5-325 MG PO TABS: 1.0000 | ORAL_TABLET | ORAL | 0 refills | Status: DC | PRN

## 2020-11-01 MED ORDER — OXYCODONE-ACETAMINOPHEN 5-325 MG PO TABS: 1.0000 | ORAL_TABLET | ORAL | 0 refills | Status: AC | PRN

## 2020-11-03 ENCOUNTER — Telehealth: Payer: Self-pay | Admitting: *Deleted

## 2020-11-03 NOTE — Telephone Encounter (Signed)
Spoke with patient, he wanted to know if he needed to wear boot at night.  I informed him to definitley wear boot to bed until after discussing with Dr. Allena Katz at his first po visit.  He also said that taking the oxycodone and ibuprofen at the same time was not helping the pain.  I instructed him to wait 1-2 hours between taking medications so it will stay in his system before the pain medication wore off.  He verbalized instructions and understanding

## 2020-11-03 NOTE — Telephone Encounter (Signed)
"  This is [employer].  I'm calling on behalf of Fernando Goodwin.  I have aquestion.  Can he take the boot off while he's in bed and do the double check that there's nothing happening?  I have questions about pain management.  Give me a call back."

## 2020-11-03 NOTE — Telephone Encounter (Signed)
I returned call to patient but had to leave message on vm to call office back

## 2020-11-03 NOTE — Progress Notes (Signed)
Subjective:  Patient ID: Fernando Goodwin, male    DOB: 1966/01/05,  MRN: [id number]  Chief Complaint  Patient presents with   Foot Pain    Right foot     55 y.o. male presents with the above complaint.  Patient presents with complaint of right calcaneal fracture.  Patient states that he was doing backyard work where he jumped off of the small ladder with crocs on that led to the fracture.  Patient states that it is very painful.  He went to the emergency room at [facility] for which she was diagnosed with a fracture and placed in a splint and sent to be followed up for surgical intervention.  He had a CT scan done while at the hospital.  He denies any other acute complaints he has not seen anyone else prior to seeing me.  He is here today for surgical consult.  His original date of injury was 10/25/2020.  He is also being seen by orthopedic specialist for labrum tear of the right shoulder.   Review of Systems: Negative except as noted in the HPI. Denies N/V/F/Ch.  Past Medical History:  Diagnosis Date   Arthritis    Asthma    Hypertension    Stroke (HCC)    2015    Current Outpatient Medications:    acetaminophen (TYLENOL) 325 MG tablet, Take 2 tablets (650 mg total) by mouth every 6 (six) hours as needed for mild pain., Disp: , Rfl:    ALPRAZolam (XANAX) 0.5 MG tablet, Take 0.5 mg by mouth at bedtime as needed for anxiety., Disp: , Rfl:    amLODipine (NORVASC) 10 MG tablet, Take 10 mg by mouth daily., Disp: , Rfl:    aspirin EC 81 MG EC tablet, Take 1 tablet (81 mg total) by mouth daily. For 30 days only, Disp: 30 tablet, Rfl: 0   atorvastatin (LIPITOR) 20 MG tablet, Take 1 tablet (20 mg total) by mouth daily at 6 PM., Disp: 30 tablet, Rfl: 2   Cetirizine HCl 10 MG CAPS, Take 10 mg by mouth daily., Disp: , Rfl:    clopidogrel (PLAVIX) 75 MG tablet, Take 1 tablet (75 mg total) by mouth daily., Disp: 30 tablet, Rfl: 3   Coenzyme Q10 (COQ10 PO), Take 1 capsule by mouth daily., Disp: , Rfl:     famotidine (PEPCID) 20 MG tablet, Take 20 mg by mouth daily. , Disp: , Rfl:    Fluticasone-Salmeterol (ADVAIR) 250-50 MCG/DOSE AEPB, Inhale 1 puff into the lungs 2 (two) times daily., Disp: , Rfl:    ibuprofen (ADVIL) 800 MG tablet, Take 1 tablet (800 mg total) by mouth every 6 (six) hours as needed., Disp: 60 tablet, Rfl: 1   losartan-hydrochlorothiazide (HYZAAR) 100-12.5 MG tablet, Take 1 tablet by mouth daily., Disp: , Rfl:    NASACORT ALLERGY 24HR 55 MCG/ACT AERO nasal inhaler, Inhale 2 sprays into the lungs daily as needed (for congestion). , Disp: , Rfl: 12   nitroGLYCERIN (NITROSTAT) 0.4 MG SL tablet, Place 1 tablet (0.4 mg total) under the tongue every 5 (five) minutes as needed for chest pain., Disp: 30 tablet, Rfl: 2   ondansetron (ZOFRAN) 4 MG tablet, Take 1 tablet (4 mg total) by mouth daily as needed., Disp: 14 tablet, Rfl: 0   oxyCODONE-acetaminophen (PERCOCET) 5-325 MG tablet, Take 1 tablet by mouth every 4 (four) hours as needed for up to 7 days for severe pain., Disp: 30 tablet, Rfl: 0   PROAIR HFA 108 (90 BASE) MCG/ACT inhaler, Inhale  2 puffs into the lungs every 6 (six) hours as needed., Disp: , Rfl: 6  Social History   Tobacco Use  Smoking Status Former  Smokeless Tobacco Never    No Known Allergies Objective:  There were no vitals filed for this visit. There is no height or weight on file to calculate BMI. Constitutional Well developed. Well nourished.  Vascular Dorsalis pedis pulses palpable bilaterally. Posterior tibial pulses palpable bilaterally. Capillary refill normal to all digits.  No cyanosis or clubbing noted. Pedal hair growth normal.  Neurologic Normal speech. Oriented to person, place, and time. Epicritic sensation to light touch grossly present bilaterally.  Dermatologic Nails well groomed and normal in appearance. No open wounds. No skin lesions.  Orthopedic: Pain on palpation of the lateral squeeze of the calcaneus.  There is ecchymosis and  bruising present.  Pain with range of motion of the ankle joint as well as the calcaneus joint.  Motor or sensory function intact.  No calf pain.  No fracture blisters noted.   Radiographs: 3 views of calcaneus noted: No varus deformity or valgus deformity noted of the calcaneus.  Good alignment noted.  Fracture of the heel noted.  IMPRESSION: 1. Severely comminuted intra-articular compression fracture of the calcaneus as described. There is severe comminution and displacement of the articular surface of the posterior facet of the subtalar joint. 2. Fracture fragments abut the peroneal tendons laterally. No evidence of tendon tear or entrapment. 3. Mild inferior subluxation at the calcaneocuboid joint. No other definite tarsal bone fractures identified Assessment:   1. Closed displaced fracture of body of right calcaneus, initial encounter    Plan:  Patient was evaluated and treated and all questions answered.  Right calcaneal fracture Allyne Gee type IV classification)  -I explained the patient etiology of calcaneal fracture and various treatment options were discussed extensively.  I reviewed the CT scan and discussed with the patient that he will benefit from ORIF of the calcaneal fracture.  He has some comminution that is primarily present on the lateral aspect of the posterior facet with depression of the posterior facet.  I discussed with him that ultimatelly this will all lead to arthritis in the future.  Patient states understanding and would like to proceed with calcaneal open reduction internal fixation with plates and screws.  I discussed my intraoperative preoperative and post operative plan in extensive detail with the patient.  I discussed with him that he will benefit from a lateral extensile approach which also has complications associated with a wedge flap necrosis.  Patient states understanding would like to proceed with the surgical plans as discussed.  Eventually in the future  patient may need surgical fusion of the subtalar joint given the comminution that is present however given that most of the posterior facet is intact except for the lateral side of the posterior facet I believe he will benefit from ORIF of the calcaneus.  Patient agrees with the plan -He will be nonweightbearing in a cam boot for 4 to 6 weeks followed by weightbearing as tolerated in a cam boot followed by regular shoes.  I discussed with him he can take up to 3 months for recovery.  Patient states understanding -Informed surgical risk consent was reviewed and read aloud to the patient.  I reviewed the films.  I have discussed my findings with the patient in great detail.  I have discussed all risks including but not limited to infection, stiffness, scarring, limp, disability, deformity, damage to blood vessels and nerves,  numbness, poor healing, need for braces, arthritis, chronic pain, amputation, death.  All benefits and realistic expectations discussed in great detail.  I have made no promises as to the outcome.  I have provided realistic expectations.  I have offered the patient a 2nd opinion, which they have declined and assured me they preferred to proceed despite the risks   No follow-ups on file.

## 2020-11-09 ENCOUNTER — Ambulatory Visit (INDEPENDENT_AMBULATORY_CARE_PROVIDER_SITE_OTHER): Payer: PRIVATE HEALTH INSURANCE

## 2020-11-09 ENCOUNTER — Other Ambulatory Visit: Payer: Self-pay

## 2020-11-09 ENCOUNTER — Ambulatory Visit (INDEPENDENT_AMBULATORY_CARE_PROVIDER_SITE_OTHER): Payer: PRIVATE HEALTH INSURANCE | Admitting: Podiatry

## 2020-11-09 DIAGNOSIS — S92011A Displaced fracture of body of right calcaneus, initial encounter for closed fracture: Secondary | ICD-10-CM

## 2020-11-09 DIAGNOSIS — Z9889 Other specified postprocedural states: Secondary | ICD-10-CM

## 2020-11-09 NOTE — Progress Notes (Signed)
Subjective:  Patient ID: Fernando Goodwin, male    DOB: December 30, 1965,  MRN: [id number]  Chief Complaint  Patient presents with   Routine Post Op    DOS 9.12.22    DOS: 11/01/2020 Procedure: Right calcaneal fracture ORIF  55 y.o. male returns for post-op check.  Patient states that he is doing well.  Pain controlled on ibuprofen and pain medication.  He has been nonweightbearing to the right lower extremity with crutches.  He also has obtained a walker.  He has been continue with RICE protocol.  He denies any other acute issues.  Review of Systems: Negative except as noted in the HPI. Denies N/V/F/Ch.  Past Medical History:  Diagnosis Date   Arthritis    Asthma    Hypertension    Stroke (HCC)    2015    Current Outpatient Medications:    acetaminophen (TYLENOL) 325 MG tablet, Take 2 tablets (650 mg total) by mouth every 6 (six) hours as needed for mild pain., Disp: , Rfl:    ALPRAZolam (XANAX) 0.5 MG tablet, Take 0.5 mg by mouth at bedtime as needed for anxiety., Disp: , Rfl:    amLODipine (NORVASC) 10 MG tablet, Take 10 mg by mouth daily., Disp: , Rfl:    aspirin EC 81 MG EC tablet, Take 1 tablet (81 mg total) by mouth daily. For 30 days only, Disp: 30 tablet, Rfl: 0   atorvastatin (LIPITOR) 20 MG tablet, Take 1 tablet (20 mg total) by mouth daily at 6 PM., Disp: 30 tablet, Rfl: 2   Cetirizine HCl 10 MG CAPS, Take 10 mg by mouth daily., Disp: , Rfl:    clopidogrel (PLAVIX) 75 MG tablet, Take 1 tablet (75 mg total) by mouth daily., Disp: 30 tablet, Rfl: 3   Coenzyme Q10 (COQ10 PO), Take 1 capsule by mouth daily., Disp: , Rfl:    famotidine (PEPCID) 20 MG tablet, Take 20 mg by mouth daily. , Disp: , Rfl:    Fluticasone-Salmeterol (ADVAIR) 250-50 MCG/DOSE AEPB, Inhale 1 puff into the lungs 2 (two) times daily., Disp: , Rfl:    ibuprofen (ADVIL) 800 MG tablet, Take 1 tablet (800 mg total) by mouth every 6 (six) hours as needed., Disp: 60 tablet, Rfl: 1   losartan-hydrochlorothiazide  (HYZAAR) 100-12.5 MG tablet, Take 1 tablet by mouth daily., Disp: , Rfl:    NASACORT ALLERGY 24HR 55 MCG/ACT AERO nasal inhaler, Inhale 2 sprays into the lungs daily as needed (for congestion). , Disp: , Rfl: 12   nitroGLYCERIN (NITROSTAT) 0.4 MG SL tablet, Place 1 tablet (0.4 mg total) under the tongue every 5 (five) minutes as needed for chest pain., Disp: 30 tablet, Rfl: 2   ondansetron (ZOFRAN) 4 MG tablet, Take 1 tablet (4 mg total) by mouth daily as needed., Disp: 14 tablet, Rfl: 0   PROAIR HFA 108 (90 BASE) MCG/ACT inhaler, Inhale 2 puffs into the lungs every 6 (six) hours as needed., Disp: , Rfl: 6  Social History   Tobacco Use  Smoking Status Former  Smokeless Tobacco Never    No Known Allergies Objective:  There were no vitals filed for this visit. There is no height or weight on file to calculate BMI. Constitutional Well developed. Well nourished.  Vascular Foot warm and well perfused. Capillary refill normal to all digits.   Neurologic Normal speech. Oriented to person, place, and time. Epicritic sensation to light touch grossly present bilaterally.  Dermatologic Skin healing well without signs of infection. Skin edges well coapted without  signs of infection.  Orthopedic: Tenderness to palpation noted about the surgical site.   Radiographs: 3 views of skeletally mature the right foot calcaneal axial view: Good correction alignment noted.  Hardware is intact.  Reduction of the posterior facet noted.  Restoration of the hindfoot as well as the posterior facet deficit noted.  Restoration of angle of gissane noted  Assessment:   1. Closed displaced fracture of body of right calcaneus, initial encounter   2. Status post foot surgery    Plan:  Patient was evaluated and treated and all questions answered.  S/p foot surgery right -Progressing as expected post-operatively. -XR: See above -WB Status: Nonweightbearing to the right lower extremity in crutches/knee  walker -Sutures: Intact.  No signs of dehiscence or complication noted. -Medications: None -Foot redressed.  No follow-ups on file.

## 2020-11-12 ENCOUNTER — Encounter: Payer: Self-pay | Admitting: Podiatry

## 2020-11-19 ENCOUNTER — Telehealth: Payer: Self-pay | Admitting: *Deleted

## 2020-11-19 NOTE — Telephone Encounter (Signed)
Patient is calling because he just slipped and fell with crutches ,lot of pain thru ankles at time of fall, now seems to be going away. Please advise. Returned call to patient and explained that he should ice and elevate,stay off foot until upcoming appointment on Tuesday.if worsens go to ER.he verbalized understanding.

## 2020-11-22 DIAGNOSIS — M79676 Pain in unspecified toe(s): Secondary | ICD-10-CM

## 2020-11-23 ENCOUNTER — Ambulatory Visit (INDEPENDENT_AMBULATORY_CARE_PROVIDER_SITE_OTHER): Payer: PRIVATE HEALTH INSURANCE | Admitting: Podiatry

## 2020-11-23 ENCOUNTER — Ambulatory Visit (INDEPENDENT_AMBULATORY_CARE_PROVIDER_SITE_OTHER): Payer: PRIVATE HEALTH INSURANCE

## 2020-11-23 ENCOUNTER — Encounter: Payer: Self-pay | Admitting: Podiatry

## 2020-11-23 ENCOUNTER — Other Ambulatory Visit: Payer: Self-pay

## 2020-11-23 DIAGNOSIS — Z9889 Other specified postprocedural states: Secondary | ICD-10-CM

## 2020-11-23 DIAGNOSIS — S92011A Displaced fracture of body of right calcaneus, initial encounter for closed fracture: Secondary | ICD-10-CM

## 2020-11-23 NOTE — Progress Notes (Signed)
Subjective:  Patient ID: Fernando Goodwin, male    DOB: 1965-09-07,  MRN: [id number]  Chief Complaint  Patient presents with   Routine Post Op    POV #2 DOS 11/01/2020 OPEN TREATMENT OF CALCANEAL FRACTURE RT    DOS: 11/01/2020 Procedure: Right calcaneal fracture ORIF  55 y.o. male returns for post-op check.  Patient states that he is doing well.  Pain controlled on ibuprofen and pain medication.  He has been nonweightbearing to the right lower extremity with crutches for the most part.  He had an incident where he slipped and fell to the right side and put a lot of pressure.  He wanted to get a new x-ray to make sure that there is nothing else going on.  Review of Systems: Negative except as noted in the HPI. Denies N/V/F/Ch.  Past Medical History:  Diagnosis Date   Arthritis    Asthma    Hypertension    Stroke (HCC)    2015    Current Outpatient Medications:    acetaminophen (TYLENOL) 325 MG tablet, Take 2 tablets (650 mg total) by mouth every 6 (six) hours as needed for mild pain., Disp: , Rfl:    ALPRAZolam (XANAX) 0.5 MG tablet, Take 0.5 mg by mouth at bedtime as needed for anxiety., Disp: , Rfl:    amLODipine (NORVASC) 10 MG tablet, Take 10 mg by mouth daily., Disp: , Rfl:    aspirin EC 81 MG EC tablet, Take 1 tablet (81 mg total) by mouth daily. For 30 days only, Disp: 30 tablet, Rfl: 0   atorvastatin (LIPITOR) 20 MG tablet, Take 1 tablet (20 mg total) by mouth daily at 6 PM., Disp: 30 tablet, Rfl: 2   Cetirizine HCl 10 MG CAPS, Take 10 mg by mouth daily., Disp: , Rfl:    clopidogrel (PLAVIX) 75 MG tablet, Take 1 tablet (75 mg total) by mouth daily., Disp: 30 tablet, Rfl: 3   Coenzyme Q10 (COQ10 PO), Take 1 capsule by mouth daily., Disp: , Rfl:    famotidine (PEPCID) 20 MG tablet, Take 20 mg by mouth daily. , Disp: , Rfl:    Fluticasone-Salmeterol (ADVAIR) 250-50 MCG/DOSE AEPB, Inhale 1 puff into the lungs 2 (two) times daily., Disp: , Rfl:    ibuprofen (ADVIL) 800 MG tablet,  Take 1 tablet (800 mg total) by mouth every 6 (six) hours as needed., Disp: 60 tablet, Rfl: 1   losartan-hydrochlorothiazide (HYZAAR) 100-12.5 MG tablet, Take 1 tablet by mouth daily., Disp: , Rfl:    NASACORT ALLERGY 24HR 55 MCG/ACT AERO nasal inhaler, Inhale 2 sprays into the lungs daily as needed (for congestion). , Disp: , Rfl: 12   nitroGLYCERIN (NITROSTAT) 0.4 MG SL tablet, Place 1 tablet (0.4 mg total) under the tongue every 5 (five) minutes as needed for chest pain., Disp: 30 tablet, Rfl: 2   ondansetron (ZOFRAN) 4 MG tablet, Take 1 tablet (4 mg total) by mouth daily as needed., Disp: 14 tablet, Rfl: 0   PROAIR HFA 108 (90 BASE) MCG/ACT inhaler, Inhale 2 puffs into the lungs every 6 (six) hours as needed., Disp: , Rfl: 6  Social History   Tobacco Use  Smoking Status Former  Smokeless Tobacco Never    No Known Allergies Objective:  There were no vitals filed for this visit. There is no height or weight on file to calculate BMI. Constitutional Well developed. Well nourished.  Vascular Foot warm and well perfused. Capillary refill normal to all digits.   Neurologic Normal speech.  Oriented to person, place, and time. Epicritic sensation to light touch grossly present bilaterally.  Dermatologic Skin has completely epithelialized.  No signs of dehiscence or skin necrosis noted.  Orthopedic: Mild enderness to palpation noted about the surgical site.   Radiographs: 3 views of skeletally mature the right foot calcaneal axial view: Good correction alignment noted.  Hardware is intact.  Reduction of the posterior facet noted.  Restoration of the hindfoot as well as the posterior facet deficit noted.  Restoration of angle of gissane noted.  No new fracture noted Assessment:   1. Closed displaced fracture of body of right calcaneus, initial encounter   2. Status post foot surgery    Plan:  Patient was evaluated and treated and all questions answered.  S/p foot surgery  right -Progressing as expected post-operatively. -XR: Repeat x-ray did not show much displacement compared to prior x-rays. -WB Status: Nonweightbearing to the right lower extremity in crutches/knee walker -Sutures: Removed no signs of dehiscence or complication noted. -Medications: None -We will begin physical therapy during next clinical visit and transition to weightbearing as tolerated  No follow-ups on file.

## 2020-12-02 ENCOUNTER — Telehealth: Payer: Self-pay | Admitting: *Deleted

## 2020-12-02 NOTE — Telephone Encounter (Signed)
I left you a message last week that your FMLA paperwork is ready.  "I've been having problems with my phone.  I'll get my wife to come by there to pick it up."  She will need to show her identification to pick it up.  "Alright, I will let her know."

## 2020-12-28 ENCOUNTER — Ambulatory Visit (INDEPENDENT_AMBULATORY_CARE_PROVIDER_SITE_OTHER): Payer: PRIVATE HEALTH INSURANCE | Admitting: Podiatry

## 2020-12-28 ENCOUNTER — Other Ambulatory Visit: Payer: Self-pay

## 2020-12-28 ENCOUNTER — Ambulatory Visit (INDEPENDENT_AMBULATORY_CARE_PROVIDER_SITE_OTHER): Payer: PRIVATE HEALTH INSURANCE

## 2020-12-28 DIAGNOSIS — Z9889 Other specified postprocedural states: Secondary | ICD-10-CM

## 2020-12-28 DIAGNOSIS — S92011A Displaced fracture of body of right calcaneus, initial encounter for closed fracture: Secondary | ICD-10-CM

## 2020-12-28 NOTE — Progress Notes (Signed)
Subjective:  Patient ID: Fernando Goodwin, male    DOB: 10/19/1965,  MRN: [id number]    DOS: 11/01/2020 Procedure: Right calcaneal fracture ORIF  55 y.o. male returns for post-op check.  Patient states that he is doing well.  He states his pain is very minimal.  He has been nonweightbearing to the right lower extremity.  He states that he is doing okay.  He is ready to begin physical therapy and start weightbearing.  Review of Systems: Negative except as noted in the HPI. Denies N/V/F/Ch.  Past Medical History:  Diagnosis Date   Arthritis    Asthma    Hypertension    Stroke (HCC)    2015    Current Outpatient Medications:    acetaminophen (TYLENOL) 325 MG tablet, Take 2 tablets (650 mg total) by mouth every 6 (six) hours as needed for mild pain., Disp: , Rfl:    ALPRAZolam (XANAX) 0.5 MG tablet, Take 0.5 mg by mouth at bedtime as needed for anxiety., Disp: , Rfl:    amLODipine (NORVASC) 10 MG tablet, Take 10 mg by mouth daily., Disp: , Rfl:    aspirin EC 81 MG EC tablet, Take 1 tablet (81 mg total) by mouth daily. For 30 days only, Disp: 30 tablet, Rfl: 0   atorvastatin (LIPITOR) 20 MG tablet, Take 1 tablet (20 mg total) by mouth daily at 6 PM., Disp: 30 tablet, Rfl: 2   Cetirizine HCl 10 MG CAPS, Take 10 mg by mouth daily., Disp: , Rfl:    clopidogrel (PLAVIX) 75 MG tablet, Take 1 tablet (75 mg total) by mouth daily., Disp: 30 tablet, Rfl: 3   Coenzyme Q10 (COQ10 PO), Take 1 capsule by mouth daily., Disp: , Rfl:    famotidine (PEPCID) 20 MG tablet, Take 20 mg by mouth daily. , Disp: , Rfl:    Fluticasone-Salmeterol (ADVAIR) 250-50 MCG/DOSE AEPB, Inhale 1 puff into the lungs 2 (two) times daily., Disp: , Rfl:    ibuprofen (ADVIL) 800 MG tablet, Take 1 tablet (800 mg total) by mouth every 6 (six) hours as needed., Disp: 60 tablet, Rfl: 1   losartan-hydrochlorothiazide (HYZAAR) 100-12.5 MG tablet, Take 1 tablet by mouth daily., Disp: , Rfl:    NASACORT ALLERGY 24HR 55 MCG/ACT AERO  nasal inhaler, Inhale 2 sprays into the lungs daily as needed (for congestion). , Disp: , Rfl: 12   nitroGLYCERIN (NITROSTAT) 0.4 MG SL tablet, Place 1 tablet (0.4 mg total) under the tongue every 5 (five) minutes as needed for chest pain., Disp: 30 tablet, Rfl: 2   ondansetron (ZOFRAN) 4 MG tablet, Take 1 tablet (4 mg total) by mouth daily as needed., Disp: 14 tablet, Rfl: 0   PROAIR HFA 108 (90 BASE) MCG/ACT inhaler, Inhale 2 puffs into the lungs every 6 (six) hours as needed., Disp: , Rfl: 6  Social History   Tobacco Use  Smoking Status Former  Smokeless Tobacco Never    No Known Allergies Objective:  There were no vitals filed for this visit. There is no height or weight on file to calculate BMI. Constitutional Well developed. Well nourished.  Vascular Foot warm and well perfused. Capillary refill normal to all digits.   Neurologic Normal speech. Oriented to person, place, and time. Epicritic sensation to light touch grossly present bilaterally.  Dermatologic Skin has completely epithelialized.  No signs of dehiscence or skin necrosis noted.  Minimal swelling noted  Orthopedic: Mild enderness to palpation noted about the surgical site.   Radiographs: 3 views of  skeletally mature the right foot calcaneal axial view: Good correction alignment noted.  Hardware is intact.  Reduction of the posterior facet noted.  Restoration of the hindfoot as well as the posterior facet deficit noted.  Restoration of angle of gissane noted.  No new fracture noted.  Good consolidation noted. Assessment:   1. Closed displaced fracture of body of right calcaneus, initial encounter   2. Status post foot surgery    Plan:  Patient was evaluated and treated and all questions answered.  S/p foot surgery right -Progressing as expected post-operatively. -XR: Repeat x-ray did not show much displacement compared to prior x-rays. -WB Status: Begin weightbearing to the right lower extremity in 20 -Sutures:  Removed no signs of dehiscence or complication noted. -Medications: None -Prescription for physical therapy was given to the patient.  No follow-ups on file.

## 2021-01-10 ENCOUNTER — Other Ambulatory Visit: Payer: Self-pay | Admitting: Surgery

## 2021-01-20 ENCOUNTER — Other Ambulatory Visit: Payer: Self-pay

## 2021-01-20 ENCOUNTER — Other Ambulatory Visit
Admission: RE | Admit: 2021-01-20 | Discharge: 2021-01-20 | Disposition: A | Discharge status: Home / Self Care | Payer: PRIVATE HEALTH INSURANCE | Source: Ambulatory Visit | Attending: Surgery | Admitting: Surgery

## 2021-01-20 DIAGNOSIS — I10 Essential (primary) hypertension: Secondary | ICD-10-CM | POA: Diagnosis not present

## 2021-01-20 DIAGNOSIS — Z01818 Encounter for other preprocedural examination: Secondary | ICD-10-CM | POA: Insufficient documentation

## 2021-01-20 HISTORY — DX: Pneumonia, unspecified organism: J18.9

## 2021-01-20 HISTORY — DX: Family history of other specified conditions: Z84.89

## 2021-01-20 HISTORY — DX: Angina pectoris, unspecified: I20.9

## 2021-01-20 HISTORY — DX: Sleep apnea, unspecified: G47.30

## 2021-01-20 HISTORY — DX: Anxiety disorder, unspecified: F41.9

## 2021-01-20 NOTE — Patient Instructions (Addendum)
Your procedure is scheduled on: 01/25/21 - Tuesday Report to the Registration Desk on the 1st floor of the [facility]. To find out your arrival time, please call [phone] between 1PM - 3PM on: 01/24/21 - Monday    REMEMBER: Instructions that are not followed completely may result in serious medical risk, up to and including death; or upon the discretion of your surgeon and anesthesiologist your surgery may need to be rescheduled.  Do not eat food after midnight the night before surgery.  No gum chewing, lozengers or hard candies.  You may however, drink CLEAR liquids up to 2 hours before you are scheduled to arrive for your surgery. Do not drink anything within 2 hours of your scheduled arrival time.  Clear liquids include: - water  - apple juice without pulp - gatorade (not RED, PURPLE, OR BLUE) - black coffee or tea (Do NOT add milk or creamers to the coffee or tea) Do NOT drink anything that is not on this list.  In addition, your doctor has ordered for you to drink the provided  Ensure Pre-Surgery Clear Carbohydrate Drink  Drinking this carbohydrate drink up to two hours before surgery helps to reduce insulin resistance and improve patient outcomes. Please complete drinking 2 hours prior to scheduled arrival time.  TAKE THESE MEDICATIONS THE MORNING OF SURGERY WITH A SIP OF WATER:  - amLODipine (NORVASC) 10 MG tablet - PARoxetine (PAXIL) 20 MG tablet  Follow recommendations from Cardiologist, Pulmonologist or PCP regarding stopping Aspirin, Coumadin, Plavix, Eliquis, Pradaxa, or Pletal. Stop beginning 01/19/21.  One week prior to surgery: Stop Anti-inflammatories (NSAIDS) such as Advil, Aleve, Ibuprofen, Motrin, Naproxen, Naprosyn and Aspirin based products such as Excedrin, Goodys Powder, BC Powder.  Stop ANY OVER THE COUNTER supplements until after surgery.  You may however, continue to take Tylenol if needed for pain up until the day of surgery.  No Alcohol for 24  hours before or after surgery.  No Smoking including e-cigarettes for 24 hours prior to surgery.  No chewable tobacco products for at least 6 hours prior to surgery.  No nicotine patches on the day of surgery.  Do not use any "recreational" drugs for at least a week prior to your surgery.  Please be advised that the combination of cocaine and anesthesia may have negative outcomes, up to and including death. If you test positive for cocaine, your surgery will be cancelled.  On the morning of surgery brush your teeth with toothpaste and water, you may rinse your mouth with mouthwash if you wish. Do not swallow any toothpaste or mouthwash.  Use CHG Soap or wipes as directed on instruction sheet.  Do not wear jewelry, make-up, hairpins, clips or nail polish.  Do not wear lotions, powders, or perfumes.   Do not shave body from the neck down 48 hours prior to surgery just in case you cut yourself which could leave a site for infection.  Also, freshly shaved skin may become irritated if using the CHG soap.  Contact lenses, hearing aids and dentures may not be worn into surgery.  Do not bring valuables to the hospital. [facility] is not responsible for any missing/lost belongings or valuables.   Notify your doctor if there is any change in your medical condition (cold, fever, infection).  Wear comfortable clothing (specific to your surgery type) to the hospital.  After surgery, you can help prevent lung complications by doing breathing exercises.  Take deep breaths and cough every 1-2 hours. Your doctor  may order a device called an Incentive Spirometer to help you take deep breaths. When coughing or sneezing, hold a pillow firmly against your incision with both hands. This is called "splinting." Doing this helps protect your incision. It also decreases belly discomfort.  If you are being admitted to the hospital overnight, leave your suitcase in the car. After surgery it may be brought to  your room.  If you are being discharged the day of surgery, you will not be allowed to drive home. You will need a responsible adult (18 years or older) to drive you home and stay with you that night.   If you are taking public transportation, you will need to have a responsible adult (18 years or older) with you. Please confirm with your physician that it is acceptable to use public transportation.   Please call the Pre-admissions Testing Dept. at [phone] if you have any questions about these instructions.  Surgery Visitation Policy:  Patients undergoing a surgery or procedure may have one family member or support person with them as long as that person is not COVID-19 positive or experiencing its symptoms.  That person may remain in the waiting area during the procedure and may rotate out with other people.  Inpatient Visitation:    Visiting hours are 7 a.m. to 8 p.m. Up to two visitors ages 16+ are allowed at one time in a patient room. The visitors may rotate out with other people during the day. Visitors must check out when they leave, or other visitors will not be allowed. One designated support person may remain overnight. The visitor must pass COVID-19 screenings, use hand sanitizer when entering and exiting the patient's room and wear a mask at all times, including in the patient's room. Patients must also wear a mask when staff or their visitor are in the room. Masking is required regardless of vaccination status.

## 2021-01-24 MED ORDER — LACTATED RINGERS IV SOLN: INTRAVENOUS | Status: DC

## 2021-01-24 MED ORDER — FAMOTIDINE 20 MG PO TABS: 20.0000 mg | ORAL_TABLET | Freq: Once | ORAL | Status: AC

## 2021-01-24 MED ORDER — CHLORHEXIDINE GLUCONATE 0.12 % MT SOLN: 15.0000 mL | Freq: Once | OROMUCOSAL | Status: AC

## 2021-01-24 MED ORDER — CEFAZOLIN SODIUM-DEXTROSE 2-4 GM/100ML-% IV SOLN
2.0000 g | INTRAVENOUS | Status: AC
  Administered 2021-01-25: 2 g via INTRAVENOUS

## 2021-01-24 MED ORDER — ORAL CARE MOUTH RINSE: 15.0000 mL | Freq: Once | OROMUCOSAL | Status: AC

## 2021-01-25 ENCOUNTER — Ambulatory Visit: Payer: PRIVATE HEALTH INSURANCE

## 2021-01-25 ENCOUNTER — Encounter: Payer: PRIVATE HEALTH INSURANCE | Admitting: Podiatry

## 2021-01-25 ENCOUNTER — Encounter: Payer: Self-pay | Admitting: Surgery

## 2021-01-25 ENCOUNTER — Ambulatory Visit: Payer: PRIVATE HEALTH INSURANCE | Admitting: Urgent Care

## 2021-01-25 ENCOUNTER — Encounter
Admission: RE | Disposition: A | Discharge status: Home / Self Care | Payer: Self-pay | Source: Ambulatory Visit | Attending: Surgery

## 2021-01-25 ENCOUNTER — Ambulatory Visit
Admission: RE | Admit: 2021-01-25 | Discharge: 2021-01-25 | Disposition: A | Discharge status: Home / Self Care | Payer: PRIVATE HEALTH INSURANCE | Source: Ambulatory Visit | Attending: Surgery | Admitting: Surgery

## 2021-01-25 ENCOUNTER — Other Ambulatory Visit: Payer: Self-pay

## 2021-01-25 DIAGNOSIS — Z7902 Long term (current) use of antithrombotics/antiplatelets: Secondary | ICD-10-CM | POA: Insufficient documentation

## 2021-01-25 DIAGNOSIS — Z8673 Personal history of transient ischemic attack (TIA), and cerebral infarction without residual deficits: Secondary | ICD-10-CM | POA: Insufficient documentation

## 2021-01-25 DIAGNOSIS — M25511 Pain in right shoulder: Secondary | ICD-10-CM

## 2021-01-25 DIAGNOSIS — G8929 Other chronic pain: Secondary | ICD-10-CM

## 2021-01-25 DIAGNOSIS — X58XXXA Exposure to other specified factors, initial encounter: Secondary | ICD-10-CM | POA: Insufficient documentation

## 2021-01-25 DIAGNOSIS — M7521 Bicipital tendinitis, right shoulder: Secondary | ICD-10-CM | POA: Insufficient documentation

## 2021-01-25 DIAGNOSIS — S43431A Superior glenoid labrum lesion of right shoulder, initial encounter: Secondary | ICD-10-CM | POA: Diagnosis present

## 2021-01-25 HISTORY — PX: SHOULDER ARTHROSCOPY WITH SUBACROMIAL DECOMPRESSION, ROTATOR CUFF REPAIR AND BICEP TENDON REPAIR: SHX5687

## 2021-01-25 SURGERY — SHOULDER ARTHROSCOPY WITH SUBACROMIAL DECOMPRESSION, ROTATOR CUFF REPAIR AND BICEP TENDON REPAIR
Anesthesia: General | Site: Shoulder | Laterality: Right

## 2021-01-25 MED ORDER — PHENYLEPHRINE HCL-NACL 20-0.9 MG/250ML-% IV SOLN
INTRAVENOUS | Status: AC
  Filled 2021-01-25: qty 250

## 2021-01-25 MED ORDER — LIDOCAINE HCL (PF) 2 % IJ SOLN
INTRAMUSCULAR | Status: AC
  Filled 2021-01-25: qty 5

## 2021-01-25 MED ORDER — ROCURONIUM BROMIDE 10 MG/ML (PF) SYRINGE
PREFILLED_SYRINGE | INTRAVENOUS | Status: AC
  Filled 2021-01-25: qty 10

## 2021-01-25 MED ORDER — FAMOTIDINE 20 MG PO TABS
ORAL_TABLET | ORAL | Status: AC
  Administered 2021-01-25: 20 mg via ORAL
  Filled 2021-01-25: qty 1

## 2021-01-25 MED ORDER — RINGERS IRRIGATION IR SOLN
Status: DC | PRN
  Administered 2021-01-25: 1

## 2021-01-25 MED ORDER — FENTANYL CITRATE (PF) 100 MCG/2ML IJ SOLN: 25.0000 ug | INTRAMUSCULAR | Status: DC | PRN

## 2021-01-25 MED ORDER — KETOROLAC TROMETHAMINE 30 MG/ML IJ SOLN
INTRAMUSCULAR | Status: AC
  Filled 2021-01-25: qty 1

## 2021-01-25 MED ORDER — FENTANYL CITRATE PF 50 MCG/ML IJ SOSY
PREFILLED_SYRINGE | INTRAMUSCULAR | Status: AC
  Administered 2021-01-25: 50 ug via INTRAVENOUS
  Filled 2021-01-25: qty 1

## 2021-01-25 MED ORDER — FENTANYL CITRATE PF 50 MCG/ML IJ SOSY: 50.0000 ug | PREFILLED_SYRINGE | Freq: Once | INTRAMUSCULAR | Status: AC

## 2021-01-25 MED ORDER — PHENYLEPHRINE HCL (PRESSORS) 10 MG/ML IV SOLN
INTRAVENOUS | Status: DC | PRN
  Administered 2021-01-25: 80 ug via INTRAVENOUS
  Administered 2021-01-25 (×2): 160 ug via INTRAVENOUS
  Administered 2021-01-25 (×2): 80 ug via INTRAVENOUS

## 2021-01-25 MED ORDER — PHENYLEPHRINE HCL-NACL 20-0.9 MG/250ML-% IV SOLN
INTRAVENOUS | Status: DC | PRN
  Administered 2021-01-25: 40 ug/min via INTRAVENOUS

## 2021-01-25 MED ORDER — SUGAMMADEX SODIUM 200 MG/2ML IV SOLN
INTRAVENOUS | Status: DC | PRN
  Administered 2021-01-25 (×2): 200 mg via INTRAVENOUS

## 2021-01-25 MED ORDER — ONDANSETRON HCL 4 MG/2ML IJ SOLN
INTRAMUSCULAR | Status: AC
  Filled 2021-01-25: qty 2

## 2021-01-25 MED ORDER — ACETAMINOPHEN 10 MG/ML IV SOLN
INTRAVENOUS | Status: DC | PRN
  Administered 2021-01-25: 1000 mg via INTRAVENOUS

## 2021-01-25 MED ORDER — ONDANSETRON HCL 4 MG/2ML IJ SOLN
INTRAMUSCULAR | Status: DC | PRN
  Administered 2021-01-25: 4 mg via INTRAVENOUS

## 2021-01-25 MED ORDER — BUPIVACAINE LIPOSOME 1.3 % IJ SUSP
INTRAMUSCULAR | Status: AC
  Filled 2021-01-25: qty 20

## 2021-01-25 MED ORDER — BUPIVACAINE HCL (PF) 0.5 % IJ SOLN
INTRAMUSCULAR | Status: DC | PRN
  Administered 2021-01-25: 10 mL via PERINEURAL

## 2021-01-25 MED ORDER — FENTANYL CITRATE (PF) 100 MCG/2ML IJ SOLN
INTRAMUSCULAR | Status: AC
  Filled 2021-01-25: qty 2

## 2021-01-25 MED ORDER — ROCURONIUM BROMIDE 100 MG/10ML IV SOLN
INTRAVENOUS | Status: DC | PRN
  Administered 2021-01-25: 60 mg via INTRAVENOUS

## 2021-01-25 MED ORDER — EPINEPHRINE PF 1 MG/ML IJ SOLN
INTRAMUSCULAR | Status: AC
  Filled 2021-01-25: qty 1

## 2021-01-25 MED ORDER — BUPIVACAINE HCL (PF) 0.5 % IJ SOLN
INTRAMUSCULAR | Status: AC
  Filled 2021-01-25: qty 10

## 2021-01-25 MED ORDER — PROPOFOL 10 MG/ML IV BOLUS
INTRAVENOUS | Status: AC
  Filled 2021-01-25: qty 40

## 2021-01-25 MED ORDER — ACETAMINOPHEN 10 MG/ML IV SOLN
INTRAVENOUS | Status: AC
  Filled 2021-01-25: qty 100

## 2021-01-25 MED ORDER — CHLORHEXIDINE GLUCONATE 0.12 % MT SOLN
OROMUCOSAL | Status: AC
  Administered 2021-01-25: 15 mL via OROMUCOSAL
  Filled 2021-01-25: qty 15

## 2021-01-25 MED ORDER — PROPOFOL 10 MG/ML IV BOLUS
INTRAVENOUS | Status: DC | PRN
  Administered 2021-01-25: 150 mg via INTRAVENOUS
  Administered 2021-01-25: 30 mg via INTRAVENOUS

## 2021-01-25 MED ORDER — MIDAZOLAM HCL 2 MG/2ML IJ SOLN: 1.0000 mg | Freq: Once | INTRAMUSCULAR | Status: AC

## 2021-01-25 MED ORDER — LACTATED RINGERS IV SOLN: INTRAVENOUS | Status: DC | PRN

## 2021-01-25 MED ORDER — FENTANYL CITRATE (PF) 100 MCG/2ML IJ SOLN
INTRAMUSCULAR | Status: DC | PRN
  Administered 2021-01-25: 50 ug via INTRAVENOUS
  Administered 2021-01-25 (×2): 25 ug via INTRAVENOUS

## 2021-01-25 MED ORDER — CEFAZOLIN SODIUM-DEXTROSE 2-4 GM/100ML-% IV SOLN
INTRAVENOUS | Status: AC
  Filled 2021-01-25: qty 100

## 2021-01-25 MED ORDER — KETOROLAC TROMETHAMINE 30 MG/ML IJ SOLN
30.0000 mg | Freq: Once | INTRAMUSCULAR | Status: AC
  Administered 2021-01-25: 30 mg via INTRAVENOUS

## 2021-01-25 MED ORDER — LIDOCAINE HCL (PF) 1 % IJ SOLN
INTRAMUSCULAR | Status: AC
  Filled 2021-01-25: qty 5

## 2021-01-25 MED ORDER — BUPIVACAINE LIPOSOME 1.3 % IJ SUSP
INTRAMUSCULAR | Status: DC | PRN
  Administered 2021-01-25: 20 mL via PERINEURAL

## 2021-01-25 MED ORDER — BUPIVACAINE-EPINEPHRINE (PF) 0.5% -1:200000 IJ SOLN
INTRAMUSCULAR | Status: AC
  Filled 2021-01-25: qty 30

## 2021-01-25 MED ORDER — BUPIVACAINE-EPINEPHRINE 0.5% -1:200000 IJ SOLN
INTRAMUSCULAR | Status: DC | PRN
  Administered 2021-01-25: 30 mL

## 2021-01-25 MED ORDER — DEXAMETHASONE SODIUM PHOSPHATE 10 MG/ML IJ SOLN
INTRAMUSCULAR | Status: DC | PRN
  Administered 2021-01-25: 10 mg via INTRAVENOUS

## 2021-01-25 MED ORDER — MIDAZOLAM HCL 2 MG/2ML IJ SOLN
INTRAMUSCULAR | Status: AC
  Administered 2021-01-25: 1 mg via INTRAVENOUS
  Filled 2021-01-25: qty 2

## 2021-01-25 MED ORDER — LIDOCAINE HCL (CARDIAC) PF 100 MG/5ML IV SOSY
PREFILLED_SYRINGE | INTRAVENOUS | Status: DC | PRN
  Administered 2021-01-25: 100 mg via INTRAVENOUS

## 2021-01-25 MED ORDER — DEXAMETHASONE SODIUM PHOSPHATE 10 MG/ML IJ SOLN
INTRAMUSCULAR | Status: AC
  Filled 2021-01-25: qty 1

## 2021-01-25 MED ORDER — LIDOCAINE HCL (PF) 1 % IJ SOLN: INTRAMUSCULAR | Status: DC | PRN

## 2021-01-25 SURGICAL SUPPLY — 50 items
ANCH SUT 2 2.9 2 LD TPR NDL (Anchor) ×1 IMPLANT
ANCHOR JUGGERKNOT WTAP NDL 2.9 (Anchor) ×1 IMPLANT
APL PRP STRL LF DISP 70% ISPRP (MISCELLANEOUS) ×1
BIT DRILL JUGRKNT W/NDL BIT2.9 (DRILL) IMPLANT
BLADE FULL RADIUS 3.5 (BLADE) ×2 IMPLANT
BUR ACROMIONIZER 4.0 (BURR) ×2 IMPLANT
CANNULA SHAVER 8MMX76MM (CANNULA) ×2 IMPLANT
CHLORAPREP W/TINT 26 (MISCELLANEOUS) ×2 IMPLANT
COVER MAYO STAND REUSABLE (DRAPES) ×2 IMPLANT
DILATOR 5.5 THREADED HEALICOIL (MISCELLANEOUS) IMPLANT
DRAPE IMP U-DRAPE 54X76 (DRAPES) ×4 IMPLANT
DRILL JUGGERKNOT W/NDL BIT 2.9 (DRILL) ×2
ELECT CAUTERY BLADE 6.4 (BLADE) ×2 IMPLANT
ELECT REM PT RETURN 9FT ADLT (ELECTROSURGICAL) ×2
ELECTRODE REM PT RTRN 9FT ADLT (ELECTROSURGICAL) ×1 IMPLANT
GAUZE SPONGE 4X4 12PLY STRL (GAUZE/BANDAGES/DRESSINGS) ×2 IMPLANT
GAUZE XEROFORM 1X8 LF (GAUZE/BANDAGES/DRESSINGS) ×2 IMPLANT
GLOVE SRG 8 PF TXTR STRL LF DI (GLOVE) ×1 IMPLANT
GLOVE SURG ENC MOIS LTX SZ7.5 (GLOVE) ×4 IMPLANT
GLOVE SURG ENC MOIS LTX SZ8 (GLOVE) ×4 IMPLANT
GLOVE SURG UNDER LTX SZ8 (GLOVE) ×2 IMPLANT
GLOVE SURG UNDER POLY LF SZ8 (GLOVE) ×2
GOWN STRL REUS W/ TWL LRG LVL3 (GOWN DISPOSABLE) ×1 IMPLANT
GOWN STRL REUS W/ TWL XL LVL3 (GOWN DISPOSABLE) ×1 IMPLANT
GOWN STRL REUS W/TWL LRG LVL3 (GOWN DISPOSABLE) ×2
GOWN STRL REUS W/TWL XL LVL3 (GOWN DISPOSABLE) ×2
GRASPER SUT 15 45D LOW PRO (SUTURE) IMPLANT
IV LACTATED RINGER IRRG 3000ML (IV SOLUTION) ×2
IV LR IRRIG 3000ML ARTHROMATIC (IV SOLUTION) ×2 IMPLANT
KIT CANNULA 8X76-LX IN CANNULA (CANNULA) IMPLANT
MANIFOLD NEPTUNE II (INSTRUMENTS) ×3 IMPLANT
MASK FACE SPIDER DISP (MASK) ×2 IMPLANT
MAT ABSORB  FLUID 56X50 GRAY (MISCELLANEOUS) ×2
MAT ABSORB FLUID 56X50 GRAY (MISCELLANEOUS) ×1 IMPLANT
PACK ARTHROSCOPY SHOULDER (MISCELLANEOUS) ×2 IMPLANT
PASSER SUT FIRSTPASS SELF (INSTRUMENTS) IMPLANT
SLING ARM LRG DEEP (SOFTGOODS) ×2 IMPLANT
SLING ULTRA II LG (MISCELLANEOUS) ×2 IMPLANT
SPONGE T-LAP 18X18 ~~LOC~~+RFID (SPONGE) ×2 IMPLANT
STAPLER SKIN PROX 35W (STAPLE) ×2 IMPLANT
STRAP SAFETY 5IN WIDE (MISCELLANEOUS) ×2 IMPLANT
SUT ETHIBOND 0 MO6 C/R (SUTURE) ×2 IMPLANT
SUT ULTRABRAID 2 COBRAID 38 (SUTURE) IMPLANT
SUT VIC AB 2-0 CT1 27 (SUTURE) ×4
SUT VIC AB 2-0 CT1 TAPERPNT 27 (SUTURE) ×2 IMPLANT
TAPE MICROFOAM 4IN (TAPE) ×2 IMPLANT
TUBING CONNECTING 10 (TUBING) ×2 IMPLANT
TUBING INFLOW SET DBFLO PUMP (TUBING) ×2 IMPLANT
WAND WEREWOLF FLOW 90D (MISCELLANEOUS) ×2 IMPLANT
WATER STERILE IRR 500ML POUR (IV SOLUTION) ×2 IMPLANT

## 2021-01-25 NOTE — Anesthesia Procedure Notes (Signed)
Anesthesia Regional Block: Interscalene brachial plexus block   Pre-Anesthetic Checklist: , timeout performed,  Correct Patient, Correct Site, Correct Laterality,  Correct Procedure, Correct Position, site marked,  Risks and benefits discussed,  Surgical consent,  Pre-op evaluation,  At surgeon's request and post-op pain management  Laterality: Right and Upper  Prep: chloraprep       Needles:  Injection technique: Single-shot  Needle Type: Stimiplex     Needle Length: 5cm  Needle Gauge: 22     Additional Needles:   Procedures:,,,, ultrasound used (permanent image in chart),,    Narrative:  Start time: 01/25/2021 1:09 PM End time: 01/25/2021 1:11 PM Injection made incrementally with aspirations every 5 mL.  Performed by: Personally  Anesthesiologist: Lenard Simmer, MD  Additional Notes: Functioning IV was confirmed and monitors were applied.  A 3mm 22ga Stimuplex needle was used. Sterile prep and drape,hand hygiene and sterile gloves were used.  Negative aspiration and negative test dose prior to incremental administration of local anesthetic. The patient tolerated the procedure well.

## 2021-01-25 NOTE — Anesthesia Preprocedure Evaluation (Signed)
Anesthesia Evaluation  Patient identified by MRN, date of birth, ID band Patient awake    Reviewed: Allergy & Precautions, H&P , NPO status , Patient's Chart, lab work & pertinent test results, reviewed documented beta blocker date and time   History of Anesthesia Complications Negative for: history of anesthetic complications  Airway Mallampati: III  TM Distance: >3 FB Neck ROM: full    Dental  (+) Dental Advidsory Given, Poor Dentition, Missing, Chipped   Pulmonary neg shortness of breath, asthma , sleep apnea , neg COPD, neg recent URI, former smoker,    Pulmonary exam normal breath sounds clear to auscultation       Cardiovascular Exercise Tolerance: Good hypertension, (-) angina(-) Past MI and (-) Cardiac Stents Normal cardiovascular exam(-) dysrhythmias (-) Valvular Problems/Murmurs Rhythm:regular Rate:Normal     Neuro/Psych neg Seizures PSYCHIATRIC DISORDERS Anxiety TIACVA, No Residual Symptoms    GI/Hepatic negative GI ROS, Neg liver ROS,   Endo/Other  negative endocrine ROS  Renal/GU negative Renal ROS  negative genitourinary   Musculoskeletal   Abdominal   Peds  Hematology negative hematology ROS (+)   Anesthesia Other Findings Past Medical History: No date: Anginal pain (HCC) No date: Anxiety No date: Arthritis No date: Asthma No date: Family history of adverse reaction to anesthesia     Comment:  mom had some nausea No date: Hypertension No date: Pneumonia No date: Sleep apnea     Comment:  no CPAP No date: Stroke [facility])     Comment:  2015   Reproductive/Obstetrics negative OB ROS                             Anesthesia Physical Anesthesia Plan  ASA: 2  Anesthesia Plan: General   Post-op Pain Management: Regional block   Induction:   PONV Risk Score and Plan: 2 and Ondansetron, Dexamethasone and Treatment may vary due to age or medical condition  Airway  Management Planned: Oral ETT  Additional Equipment:   Intra-op Plan:   Post-operative Plan: Extubation in OR  Informed Consent: I have reviewed the patients History and Physical, chart, labs and discussed the procedure including the risks, benefits and alternatives for the proposed anesthesia with the patient or authorized representative who has indicated his/her understanding and acceptance.     Dental Advisory Given  Plan Discussed with: Anesthesiologist, CRNA and Surgeon  Anesthesia Plan Comments:         Anesthesia Quick Evaluation

## 2021-01-25 NOTE — Transfer of Care (Signed)
Immediate Anesthesia Transfer of Care Note  Patient: Fernando Goodwin  Procedure(s) Performed: SHOULDER ARTHROSCOPY WITH DEBRIDEMENT, DECOMPRESSION, BICEPS TENODESIS (Right: Shoulder)  Patient Location: PACU  Anesthesia Type:General  Level of Consciousness: awake, alert  and oriented  Airway & Oxygen Therapy: Patient Spontanous Breathing  Post-op Assessment: Report given to RN and Post -op Vital signs reviewed and stable  Post vital signs: Reviewed and stable  Last Vitals:  Vitals Value Taken Time  BP 139/94 01/25/21 1512  Temp    Pulse 82 01/25/21 1515  Resp 14 01/25/21 1515  SpO2 100 % 01/25/21 1515  Vitals shown include unvalidated device data.  Last Pain:  Vitals:   01/25/21 1246  PainSc: 2          Complications: No notable events documented.

## 2021-01-25 NOTE — Op Note (Signed)
01/25/2021  2:52 PM  Patient:   Fernando Goodwin  Pre-Op Diagnosis:   Impingement/tendinopathy with SLAP tear and biceps tendinopathy, right shoulder  Post-Op Diagnosis:   Impingement/tendinopathy with SLAP tear and biceps tendinopathy, right shoulder.  Procedure:   Extensive arthroscopic debridement, arthroscopic subacromial decompression, and mini-open biceps tenodesis, right shoulder.  Anesthesia:   General endotracheal with interscalene block using Exparel placed preoperatively by the anesthesiologist.  Surgeon:   Maryagnes Amos, MD  Assistant:   Horris Latino, PA-C  Findings:   As above. There was a Type I SLAP tear with partial tearing of the biceps tendon insertion onto the labrum. There was a small longitudinal tear involving the superior insertional fibers of the subscapularis tendon without compromise of the footprint, as well as mild fraying of the articular insertional fibers of the supraspinatus tendon, also without compromise of the footprint. The remainder of the rotator cuff was in satisfactory condition. There was moderate "lip sticking" of the biceps tendon without partial or full-thickness tearing. The articular surfaces of the glenoid and humerus both were in satisfactory condition.  Complications:   None  Fluids:   500 cc  Estimated blood loss:   10 cc  Tourniquet time:   None  Drains:   None  Closure:   Staples      Brief clinical note:   The patient is a 55 year old male who sustained an injury to his right shoulder in July, 2022 while lifting a heavy object at work. The patient's symptoms have progressed despite medications, activity modification, etc. The patient's history and examination are consistent with impingement/tendinopathy with biceps tendinitis and a possible rotator cuff tear. The preoperative MRI scan confirmed the presence of a SLAP tear with biceps tendinitis and a small interstitial tear involving the mid substance of the subscapularis tendon.  The patient presents at this time for definitive management of his shoulder symptoms.  Procedure:   The patient underwent placement of an interscalene block using Exparel by the anesthesiologist in the preoperative holding area before being brought into the operating room and lain in the supine position. The patient then underwent general endotracheal intubation and anesthesia before being repositioned in the beach chair position using the beach chair positioner. The right shoulder and upper extremity were prepped with ChloraPrep solution before being draped sterilely. Preoperative antibiotics were administered. A timeout was performed to confirm the proper surgical site before the expected portal sites and incision site were injected with 0.5% Sensorcaine with epinephrine.   A posterior portal was created and the glenohumeral joint thoroughly inspected with the findings as described above. An anterior portal was created using an outside-in technique. The labrum and rotator cuff were further probed, again confirming the above-noted findings.  Areas of labral fraying were debrided back to stable margins using the full-radius resector. In addition, the areas of partial-thickness tearing involving the supraspinatus and subscapularis tendons also were debrided back to stable margins using the full-radius resector. Finally, areas of synovitis were debrided back to stable margins using the full-radius resector. The ArthroCare wand was inserted and used to release the biceps tendon from its labral anchor.  It also was used to obtain hemostasis as well as to "anneal" the labrum superiorly and anteriorly. The instruments were removed from the joint after suctioning the excess fluid.  The camera was repositioned through the posterior portal into the subacromial space. A separate lateral portal was created using an outside-in technique. The 3.5 mm full-radius resector was introduced and used to perform  a subtotal  bursectomy. The ArthroCare wand was then inserted and used to remove the periosteal tissue off the undersurface of the anterior third of the acromion as well as to recess the coracoacromial ligament from its attachment along the anterior and lateral margins of the acromion. The 4.0 mm acromionizing bur was introduced and used to complete the decompression by removing the undersurface of the anterior third of the acromion. The full radius resector was reintroduced to remove any residual bony debris before the ArthroCare wand was reintroduced to obtain hemostasis. The instruments were then removed from the subacromial space after suctioning the excess fluid.  An approximately 4-5 cm incision was made over the anterolateral aspect of the shoulder beginning at the anterolateral corner of the acromion and extending distally in line with the bicipital groove. This incision was carried down through the subcutaneous tissues to expose the deltoid fascia. The raphae between the anterior and middle thirds was identified and this plane developed to provide access into the subacromial space. Additional bursal tissues were debrided sharply using Metzenbaum scissors. The rotator cuff was readily identified. No evidence of partial or full-thickness bursal sided rotator cuff tearing was identified.  The bicipital groove was identified by palpation and opened for 1-1.5 cm. The biceps tendon stump was retrieved through this defect. The floor of the bicipital groove was roughened with a curet before a single Biomet 2.9 mm JuggerKnot anchor was inserted. Both sets of sutures were passed through the biceps tendon and tied securely to effect the tenodesis. The bicipital sheath was reapproximated using two #0 Ethibond interrupted sutures, incorporating the biceps tendon to further reinforce the tenodesis.  The wound was copiously irrigated with sterile saline solution before the deltoid raphae was reapproximated using 2-0 Vicryl  interrupted sutures. The subcutaneous tissues were closed in two layers using 2-0 Vicryl interrupted sutures before the skin was closed using staples. The portal sites also were closed using staples. A sterile bulky dressing was applied to the shoulder before the arm was placed into a shoulder immobilizer. The patient was then awakened, extubated, and returned to the recovery room in satisfactory condition after tolerating the procedure well.

## 2021-01-25 NOTE — Anesthesia Procedure Notes (Addendum)
Procedure Name: Intubation Date/Time: 01/25/2021 1:37 PM Performed by: Sherlie Ban, RN Pre-anesthesia Checklist: Patient identified, Emergency Drugs available, Suction available and Patient being monitored Patient Re-evaluated:Patient Re-evaluated prior to induction Oxygen Delivery Method: Circle system utilized Preoxygenation: Pre-oxygenation with 100% oxygen Induction Type: IV induction Ventilation: Oral airway inserted - appropriate to patient size and Two handed mask ventilation required Laryngoscope Size: Mac, McGraph and 4 Grade View: Grade I Tube type: Oral Tube size: 7.5 mm Number of attempts: 2 Airway Equipment and Method: Stylet and Oral airway Placement Confirmation: ETT inserted through vocal cords under direct vision, positive ETCO2 and breath sounds checked- equal and bilateral Secured at: 24 cm Tube secured with: Tape Dental Injury: Teeth and Oropharynx as per pre-operative assessment  Comments: Atraumatic intubation by C. Ernestine Conrad, [place]; attempted DL with Mac 4, unable to visualize anterior airway. Switched to [employer] blade, Grade I view.

## 2021-01-25 NOTE — Discharge Instructions (Addendum)
Orthopedic discharge instructions: Keep dressing dry and intact.  May shower after dressing changed on post-op day #4 (Saturday).  Cover staples with Band-Aids after drying off. Apply ice frequently to shoulder. Take ibuprofen 600-800 mg TID with meals for 7-10 days, then as necessary. Take pain medication as prescribed when needed.  May supplement with ES Tylenol if necessary. May resume Plavix tomorrow morning. Keep shoulder immobilizer on at all times except may remove for bathing purposes. Follow-up in 10-14 days or as scheduled.  AMBULATORY SURGERY  DISCHARGE INSTRUCTIONS   The drugs that you were given will stay in your system until tomorrow so for the next 24 hours you should not:  Drive an automobile Make any legal decisions Drink any alcoholic beverage   You may resume regular meals tomorrow.  Today it is better to start with liquids and gradually work up to solid foods.  You may eat anything you prefer, but it is better to start with liquids, then soup and crackers, and gradually work up to solid foods.   Please notify your doctor immediately if you have any unusual bleeding, trouble breathing, redness and pain at the surgery site, drainage, fever, or pain not relieved by medication.    Additional Instructions:        Please contact your physician with any problems or Same Day Surgery at [phone], Monday through Friday 6 am to 4 pm, or [place] at [facility] number at [phone].      Interscalene Nerve Block with Exparel   For your surgery you have received an Interscalene Nerve Block with Exparel. Nerve Blocks affect many types of nerves, including nerves that control movement, pain and normal sensation.  You may experience feelings such as numbness, tingling, heaviness, weakness or the inability to move your arm or the feeling or sensation that your arm has "fallen asleep". A nerve block with Exparel can last up to 5 days.  Usually the weakness  wears off first.  The tingling and heaviness usually wear off next.  Finally you may start to notice pain.  Keep in mind that this may occur in any order.  Once a nerve block starts to wear off it is usually completely gone within 60 minutes. ISNB may cause mild shortness of breath, a hoarse voice, blurry vision, unequal pupils, or drooping of the face on the same side as the nerve block.  These symptoms will usually resolve with the numbness.  Very rarely the procedure itself can cause mild seizures. If needed, your surgeon will give you a prescription for pain medication.  It will take about 60 minutes for the oral pain medication to become fully effective.  So, it is recommended that you start taking this medication before the nerve block first begins to wear off, or when you first begin to feel discomfort. Take your pain medication only as prescribed.  Pain medication can cause sedation and decrease your breathing if you take more than you need for the level of pain that you have. Nausea is a common side effect of many pain medications.  You may want to eat something before taking your pain medicine to prevent nausea. After an Interscalene nerve block, you cannot feel pain, pressure or extremes in temperature in the effected arm.  Because your arm is numb it is at an increased risk for injury.  To decrease the possibility of injury, please practice the following:  While you are awake change the position of your arm frequently to prevent too much  pressure on any one area for prolonged periods of time.  If you have a cast or tight dressing, check the color or your fingers every couple of hours.  Call your surgeon with the appearance of any discoloration (white or blue). If you are given a sling to wear before you go home, please wear it  at all times until the block has completely worn off.  Do not get up at night without your sling. Please contact [facility] or your surgeon if you do not begin to  regain sensation after 7 days from the surgery.  Anesthesia may be contacted by calling the Same Day Surgery Department, Mon. through Fri., 6 am to 4 pm at [phone].   If you experience any other problems or concerns, please contact your surgeon's office. If you experience severe or prolonged shortness of breath go to the nearest emergency department.

## 2021-01-25 NOTE — H&P (Signed)
History of Present Illness: Fernando Goodwin is a 55 y.o. who presents today for history and physical. He is to undergo right shoulder arthroscopy on 01/25/2021. Last seen in the clinic on 10/28/2020. No change in regards to the right shoulder. Is recovering from right calcaneal fracture.  The patient was evaluated by his primary care physician in July of this year. In early July of this year the patient was at work when he lifted a heavy object and felt a "pull" in his right shoulder. He denies a pop sensation he denies a deformity initially after the injury. The patient was then evaluated by his primary care physician. His primary care physician did order an MRI scan of the right shoulder and also prescribed Mobic, Flexeril and a prednisone taper as well. The patient underwent an MRI scan which is detailed in the imaging section below, he does state that he did feel some relief with the Mobic and prednisone however the pain soon returned shortly after completing these medications. At the time the patient did file this injury under Worker's Compensation. The patient was referred to a orthopedic provider who reviewed the MRI scan and performed what sounds like a subacromial steroid injection. The patient states that this did provide some relief however it did not last longer than 24 hours. The patient was then reevaluated by internal medicine and requested a second opinion with [facility] clinic orthopedics. Internal medicine prescribed a short course of hydrocodone and encouraged the patient to continue to take naproxen and also wrote the patient a work until 10/26/2020. The patient is no longer following this injury under Worker's Comp., the patient states that this is going to be following her personal insurance at this time. The patient is still experiencing a lot of discomfort primarily along the anterior aspect of the right shoulder. The patient reports increased discomfort especially when trying to grab objects  and lift above his head or out from his body. He denies any loss of range of motion, he denies any weakness to the right shoulder. The patient reports aching and throbbing discomfort. He does report some pain that radiates into the neck and does report occasional tingling in his fingers but states that this is not constant. He does have increased discomfort when attempting to sleep at night. The patient does have a history of stroke and is on Plavix. He denies any personal history of heart attack, asthma or COPD. The patient has been applying ice packs and alternating with heat as well.  Past Medical History:   Allergic state   Anxiety   Asthma without status asthmaticus, unspecified   Diverticulosis   GERD (gastroesophageal reflux disease)   History of panic disorder 10/13/2013   Hypertension   Panic disorder   Reactive arthritis (CMS-HCC)  balanitis, oral lesion, synovitis right hand, bilateral knees, left foot, positive HLA-B27, s/p sulfasalazine, Enbrel, methotrexate, psoriasiform rash   Stroke (CMS-HCC)   Past Surgical History  HERNIA REPAIR Bilateral 2012 (Dr. Tamala Julian)   Past Family History  Breast cancer Mother   Bilateral Breast Cancer Mother   Dementia Father   Fainting (Syncope-sudden loss of consciousness) Father   High blood pressure (Hypertension) Father    Medications:  amLODIPine (NORVASC) 10 MG tablet Take 1 tablet (10 mg total) by mouth once daily for 180 days 90 tablet 1   cetirizine (ZYRTEC) 10 mg capsule Take 10 mg by mouth once daily.   clopidogreL (PLAVIX) 75 mg tablet Take 1 tablet (75 mg total) by mouth  once daily 90 tablet 1   famotidine (PEPCID) 40 MG tablet Take 1 tablet (40 mg total) by mouth nightly (Patient taking differently: Take 1 tablet (40 mg total) by mouth at bedtime As needed) 30 tablet 3   hydroCHLOROthiazide (HYDRODIURIL) 25 MG tablet hydrochlorothiazide 25 mg tablet TAKE 1 TABLET BY MOUTH ONCE DAILY   ibuprofen (MOTRIN) 800 MG tablet Take by mouth    losartan-hydrochlorothiazide (HYZAAR) 100-25 mg tablet Take 1 tablet by mouth once daily 30 tablet 5   naproxen sodium (ALEVE) 220 MG tablet Take 440 mg by mouth once daily   nitroGLYcerin (NITROSTAT) 0.4 MG SL tablet PLACE 1 TABLET UNDER THE TONGUE EVERY 5 MINUTES AS NEEDED CHEST PAIN 25 tablet 0   ondansetron (ZOFRAN) 4 MG tablet Take 1 tablet (4 mg total) by mouth once daily as needed   PARoxetine (PAXIL) 20 MG tablet Take 1 tablet (20 mg total) by mouth once daily for 90 days 90 tablet 1   ubidecarenone (COENZYME Q10) 100 mg Tab Take by mouth.   albuterol (PROVENTIL) 2.5 mg /3 mL (0.083 %) nebulizer solution Take 3 mLs (2.5 mg total) by nebulization every 6 (six) hours as needed for Wheezing (Patient not taking: Reported on 01/19/2021) 75 mL 12   Allergies: No Known Allergies   Review of Systems: A comprehensive 14 point ROS was performed, reviewed, and the pertinent orthopaedic findings are documented in the HPI.  Physical Exam: BP 124/82 (BP Location: Left upper arm, Patient Position: Sitting, BP Cuff Size: Adult)  Ht 172.7 cm (_0 )  Wt 100 kg (220 lb 6.4 oz)  BMI 33.51 kg/m   General: Well-developed well-nourished male seen in no acute distress.   HEENT: Atraumatic,normocephalic. Pupils are equal and reactive to light. Oropharynx is clear with moist mucosa  Lungs: Clear to auscultation bilaterally   Cardiovascular: Regular rate and rhythm. Normal S1, S2. No murmurs. No appreciable gallops or rubs. Peripheral pulses are palpable.  Abdomen: Soft, non-tender, nondistended. Bowel sounds present  Right Upper Extremity: Examination of the right shoulder and arm showed no bony abnormality or edema. Actively the patient is able to achieve 130 degrees forward flexion, 120 degrees of abduction. With the right arm abducted 90 degrees he can tolerate external rotation 80 degrees with moderate pain and internal rotation 60 degrees. The patient does have full passive range of  motion to the right shoulder at today's visit. Mildly tender with impingement testing of the right shoulder, patient does have a positive Hawkins test. He is moderately tender to palpation over the proximal bicep tendon, positive speeds test, positive Yergason's test. Positive O'Brien's test of the right upper extremity. The patient has a negative drop arm test. Mild tender palpation along the subacromial space, no AC joint tenderness. The patient has no instability of the shoulder with anterior-posterior motion. There is a negative sulcus sign. Negative posterior drawer test. Negative apprehension test. The rotator cuff muscle strength is 4/5 with supraspinatus, 4/5 with internal rotation, and 4/5 with external rotation. The patient does have increased discomfort with both resisted internal rotation and external rotation with the arm at his side. There is no crepitus with range of motion activities.   Neurological: The patient is alert and oriented Sensation to light touch appears to be intact and within normal limits Gross motor strength appeared to be equal to 5/5  Vascular : Peripheral pulses felt to be palpable. Capillary refill appears to be intact and within normal limits  MRI OF THE RIGHT SHOULDER WITHOUT CONTRAST:  1. Mild-moderate rotator cuff tendinosis with tiny interstitial tear  of the distal subscapularis tendon. No full-thickness rotator cuff  tear.  2. Moderate intra-articular biceps tendinosis.  3. Circumferential heterogeneity of the labrum. Lobulated  periarticular cystic structures which appear to track from the  superior labrum into the suprascapular notch most suggestive of  paralabral cysts. Dominant cystic component within the suprascapular  notch measures approximately 2.5 x 1.4 x 1.4 cm.  4. There is an additional 0.5 cm cyst along the inferior glenoid  neck which may reflect an additional paralabral cyst or possibly  small ganglion.  5. No denervation changes are  seen within the shoulder musculature  at this time.  6. Moderate AC joint arthropathy.    Impression: Superior glenoid labrum lesion of right shoulder. Right rotator cuff tendonitis. Biceps tendonitis on right.  Plan:  The treatment options, including both surgical and nonsurgical choices, have been discussed in detail with the patient and his/her family. The risks (including bleeding, infection, nerve and/or blood vessel injury, persistent or recurrent pain, loosening or failure of the components, leg length inequality, dislocation, need for further surgery, blood clots, strokes, heart attacks or arrhythmias, pneumonia, etc.) and benefits of the surgical procedure were discussed. The patient states his/her understanding and agrees to proceed. _0 @ agrees to a blood transfusion if necessary. A formal written consent will be obtained by the nursing staff.    H&P reviewed and patient re-examined. No changes.

## 2021-01-26 ENCOUNTER — Encounter: Payer: Self-pay | Admitting: Surgery

## 2021-01-26 NOTE — Anesthesia Postprocedure Evaluation (Signed)
Anesthesia Post Note  Patient: Fernando Goodwin  Procedure(s) Performed: SHOULDER ARTHROSCOPY WITH DEBRIDEMENT, DECOMPRESSION, BICEPS TENODESIS (Right: Shoulder)  Patient location during evaluation: PACU Anesthesia Type: General Level of consciousness: awake and alert Pain management: pain level controlled Vital Signs Assessment: post-procedure vital signs reviewed and stable Respiratory status: spontaneous breathing, nonlabored ventilation, respiratory function stable and patient connected to nasal cannula oxygen Cardiovascular status: blood pressure returned to baseline and stable Postop Assessment: no apparent nausea or vomiting Anesthetic complications: no   No notable events documented.   Last Vitals:  Vitals:   01/25/21 1545 01/25/21 1554  BP: (!) 116/100 (!) 157/97  Pulse: 80 78  Resp: 17 16  Temp: (!) 36.2 C (!) 36.3 C  SpO2: 96% 95%    Last Pain:  Vitals:   01/25/21 1554  TempSrc: Temporal  PainSc: 0-No pain                 Lenard Simmer

## 2021-01-27 ENCOUNTER — Other Ambulatory Visit: Payer: Self-pay

## 2021-01-27 ENCOUNTER — Ambulatory Visit (INDEPENDENT_AMBULATORY_CARE_PROVIDER_SITE_OTHER): Payer: PRIVATE HEALTH INSURANCE | Admitting: Podiatry

## 2021-01-27 ENCOUNTER — Emergency Department
Admission: EM | Admit: 2021-01-27 | Discharge: 2021-01-27 | Disposition: A | Discharge status: Home / Self Care | Payer: PRIVATE HEALTH INSURANCE | Attending: Emergency Medicine | Admitting: Emergency Medicine

## 2021-01-27 ENCOUNTER — Encounter: Payer: Self-pay | Admitting: Emergency Medicine

## 2021-01-27 ENCOUNTER — Ambulatory Visit (INDEPENDENT_AMBULATORY_CARE_PROVIDER_SITE_OTHER): Payer: PRIVATE HEALTH INSURANCE

## 2021-01-27 ENCOUNTER — Emergency Department: Payer: PRIVATE HEALTH INSURANCE

## 2021-01-27 DIAGNOSIS — Z20822 Contact with and (suspected) exposure to covid-19: Secondary | ICD-10-CM | POA: Diagnosis not present

## 2021-01-27 DIAGNOSIS — Q666 Other congenital valgus deformities of feet: Secondary | ICD-10-CM | POA: Diagnosis not present

## 2021-01-27 DIAGNOSIS — S92011A Displaced fracture of body of right calcaneus, initial encounter for closed fracture: Secondary | ICD-10-CM

## 2021-01-27 DIAGNOSIS — Z7902 Long term (current) use of antithrombotics/antiplatelets: Secondary | ICD-10-CM | POA: Insufficient documentation

## 2021-01-27 DIAGNOSIS — Z5321 Procedure and treatment not carried out due to patient leaving prior to being seen by health care provider: Secondary | ICD-10-CM | POA: Insufficient documentation

## 2021-01-27 DIAGNOSIS — R059 Cough, unspecified: Secondary | ICD-10-CM | POA: Diagnosis not present

## 2021-01-27 DIAGNOSIS — Z9889 Other specified postprocedural states: Secondary | ICD-10-CM

## 2021-01-27 DIAGNOSIS — R0789 Other chest pain: Secondary | ICD-10-CM | POA: Insufficient documentation

## 2021-01-27 DIAGNOSIS — R0602 Shortness of breath: Secondary | ICD-10-CM | POA: Diagnosis not present

## 2021-01-27 LAB — RESP PANEL BY RT-PCR (FLU A&B, COVID) ARPGX2
Influenza A by PCR: NEGATIVE
Influenza B by PCR: NEGATIVE
SARS Coronavirus 2 by RT PCR: NEGATIVE

## 2021-01-27 LAB — CBC
HCT: 42.3 % (ref 39.0–52.0)
Hemoglobin: 14.6 g/dL (ref 13.0–17.0)
MCH: 29.3 pg (ref 26.0–34.0)
MCHC: 34.5 g/dL (ref 30.0–36.0)
MCV: 84.8 fL (ref 80.0–100.0)
Platelets: 276 10*3/uL (ref 150–400)
RBC: 4.99 MIL/uL (ref 4.22–5.81)
RDW: 12.8 % (ref 11.5–15.5)
WBC: 14.7 10*3/uL — ABNORMAL HIGH (ref 4.0–10.5)
nRBC: 0 % (ref 0.0–0.2)

## 2021-01-27 LAB — BASIC METABOLIC PANEL
Anion gap: 7 (ref 5–15)
BUN: 17 mg/dL (ref 6–20)
CO2: 25 mmol/L (ref 22–32)
Calcium: 8.2 mg/dL — ABNORMAL LOW (ref 8.9–10.3)
Chloride: 101 mmol/L (ref 98–111)
Creatinine, Ser: 0.88 mg/dL (ref 0.61–1.24)
GFR, Estimated: >60 mL/min (ref >60)
Glucose, Bld: 132 mg/dL — ABNORMAL HIGH (ref 70–99)
Potassium: 3.5 mmol/L (ref 3.5–5.1)
Sodium: 133 mmol/L — ABNORMAL LOW (ref 135–145)

## 2021-01-27 LAB — TROPONIN I (HIGH SENSITIVITY)
Troponin I (High Sensitivity): 5 ng/L (ref <18)
Troponin I (High Sensitivity): 5 ng/L (ref <18)

## 2021-01-27 NOTE — ED Triage Notes (Signed)
Pt to ED from home c/o left chest pain and SOB approx 2 hours PTA.  Has had a productive cough at home.  Denies n/v/d.  States had right rotator cuff surgery on 12/6.  Is on Plavix.  Pt A&Ox4, chest rise even and unlabored, skin WNL and in NAD at this time.

## 2021-02-03 ENCOUNTER — Encounter: Payer: Self-pay | Admitting: Podiatry

## 2021-02-03 NOTE — Progress Notes (Addendum)
Subjective:  Patient ID: Fernando Goodwin, male    DOB: 1965-11-03,  MRN: [id number]    DOS: 11/01/2020 Procedure: Right calcaneal fracture ORIF  55 y.o. male returns for post-op check.  Patient states that he is doing well.  He states his pain is very minimal.  He has been nonweightbearing to the right lower extremity.  He states that he is doing okay.  He is doing physical therapy seems to be helping.  He is ambulating in regular shoes  Review of Systems: Negative except as noted in the HPI. Denies N/V/F/Ch.  Past Medical History:  Diagnosis Date   Anginal pain (HCC)    Anxiety    Arthritis    Asthma    Family history of adverse reaction to anesthesia    mom had some nausea   Hypertension    Pneumonia    Sleep apnea    no CPAP   Stroke (HCC)    2015    Current Outpatient Medications:    acetaminophen (TYLENOL) 325 MG tablet, Take 2 tablets (650 mg total) by mouth every 6 (six) hours as needed for mild pain., Disp: , Rfl:    amLODipine (NORVASC) 10 MG tablet, Take 10 mg by mouth daily., Disp: , Rfl:    cetirizine (ZYRTEC) 10 MG tablet, Take 10 mg by mouth daily., Disp: , Rfl:    clopidogrel (PLAVIX) 75 MG tablet, Take 1 tablet (75 mg total) by mouth daily., Disp: 30 tablet, Rfl: 3   Coenzyme Q10 (COQ10 PO), Take 1 capsule by mouth daily., Disp: , Rfl:    ibuprofen (ADVIL) 800 MG tablet, Take 1 tablet (800 mg total) by mouth every 6 (six) hours as needed., Disp: 60 tablet, Rfl: 1   losartan-hydrochlorothiazide (HYZAAR) 100-12.5 MG tablet, Take 1 tablet by mouth daily., Disp: , Rfl:    nitroGLYCERIN (NITROSTAT) 0.4 MG SL tablet, Place 1 tablet (0.4 mg total) under the tongue every 5 (five) minutes as needed for chest pain., Disp: 30 tablet, Rfl: 2   PARoxetine (PAXIL) 20 MG tablet, Take 10 mg by mouth daily., Disp: , Rfl:   Social History   Tobacco Use  Smoking Status Former  Smokeless Tobacco Never    Allergies  Allergen Reactions   Oxycodone Nausea Only    Can  tolerate with nausea medications   Objective:  There were no vitals filed for this visit. There is no height or weight on file to calculate BMI. Constitutional Well developed. Well nourished.  Vascular Foot warm and well perfused. Capillary refill normal to all digits.   Neurologic Normal speech. Oriented to person, place, and time. Epicritic sensation to light touch grossly present bilaterally.  Dermatologic Skin has completely epithelialized.  No signs of dehiscence or skin necrosis noted.  Minimal swelling noted.  He has a semiflexible pes planovalgus foot structure with too many toe signs partially recruit the arch with dorsiflexion of the hallux.  Unable to perform single and double heel raise.  Orthopedic: Mild enderness to palpation noted about the surgical site.   Radiographs: 3 views of skeletally mature the right foot calcaneal axial view: Good correction alignment noted.  Hardware is intact.  Reduction of the posterior facet noted.  Restoration of the hindfoot as well as the posterior facet deficit noted.  Restoration of angle of gissane noted.  No new fracture noted.  Good consolidation noted. Assessment:   1. Closed displaced fracture of body of right calcaneus, initial encounter   2. Status post foot surgery  3. Pes planovalgus    Plan:  Patient was evaluated and treated and all questions answered.  S/p foot surgery right -Progressing as expected post-operatively. -XR: Repeat x-ray did not show much displacement compared to prior x-rays. -WB Status: Weightbearing as tolerated in regular shoes -Sutures: Removed no signs of dehiscence or complication noted. -Medications: None -Continue physical therapy  Pes planovalgus -I explained to patient the etiology of pes planovalgus and relationship with calcaneal fracture/foot structure change and various treatment options were discussed.  Given patient foot structure in the setting of Planter fasciitis I believe patient will  benefit from custom-made orthotics to help control the hindfoot motion support the arch of the foot and take the stress away from plantar fascial.  Patient agrees with the plan like to proceed with orthotics -Patient was casted for orthotics    No follow-ups on file.

## 2021-02-03 NOTE — Addendum Note (Signed)
Addended by: Nicholes Rough on: 02/03/2021 07:31 AM   Modules accepted: Level of Service

## 2021-03-10 ENCOUNTER — Ambulatory Visit (INDEPENDENT_AMBULATORY_CARE_PROVIDER_SITE_OTHER): Payer: PRIVATE HEALTH INSURANCE | Admitting: Podiatry

## 2021-03-10 ENCOUNTER — Other Ambulatory Visit: Payer: Self-pay

## 2021-03-10 ENCOUNTER — Ambulatory Visit (INDEPENDENT_AMBULATORY_CARE_PROVIDER_SITE_OTHER): Payer: PRIVATE HEALTH INSURANCE

## 2021-03-10 DIAGNOSIS — S92011A Displaced fracture of body of right calcaneus, initial encounter for closed fracture: Secondary | ICD-10-CM

## 2021-03-10 DIAGNOSIS — Q666 Other congenital valgus deformities of feet: Secondary | ICD-10-CM

## 2021-03-10 NOTE — Progress Notes (Signed)
Subjective:  Patient ID: Fernando Goodwin, male    DOB: 1965/09/25,  MRN: [id number]    DOS: 11/01/2020 Procedure: Right calcaneal fracture ORIF  56 y.o. male returns for post-op check.  Patient states that he is doing well.  He has occasional pain likely from weather changes otherwise has been weightbearing as tolerated regular shoes physical therapy went well.  He denies any other acute complaints he is ready to go back to work on 31st.  S  Review of Systems: Negative except as noted in the HPI. Denies N/V/F/Ch.  Past Medical History:  Diagnosis Date   Anginal pain (HCC)    Anxiety    Arthritis    Asthma    Family history of adverse reaction to anesthesia    mom had some nausea   Hypertension    Pneumonia    Sleep apnea    no CPAP   Stroke (HCC)    2015    Current Outpatient Medications:    acetaminophen (TYLENOL) 325 MG tablet, Take 2 tablets (650 mg total) by mouth every 6 (six) hours as needed for mild pain., Disp: , Rfl:    amLODipine (NORVASC) 10 MG tablet, Take 10 mg by mouth daily., Disp: , Rfl:    cetirizine (ZYRTEC) 10 MG tablet, Take 10 mg by mouth daily., Disp: , Rfl:    clopidogrel (PLAVIX) 75 MG tablet, Take 1 tablet (75 mg total) by mouth daily., Disp: 30 tablet, Rfl: 3   Coenzyme Q10 (COQ10 PO), Take 1 capsule by mouth daily., Disp: , Rfl:    ibuprofen (ADVIL) 800 MG tablet, Take 1 tablet (800 mg total) by mouth every 6 (six) hours as needed., Disp: 60 tablet, Rfl: 1   losartan-hydrochlorothiazide (HYZAAR) 100-12.5 MG tablet, Take 1 tablet by mouth daily., Disp: , Rfl:    nitroGLYCERIN (NITROSTAT) 0.4 MG SL tablet, Place 1 tablet (0.4 mg total) under the tongue every 5 (five) minutes as needed for chest pain., Disp: 30 tablet, Rfl: 2   PARoxetine (PAXIL) 20 MG tablet, Take 10 mg by mouth daily., Disp: , Rfl:   Social History   Tobacco Use  Smoking Status Former  Smokeless Tobacco Never    Allergies  Allergen Reactions   Oxycodone Nausea Only    Can  tolerate with nausea medications   Objective:  There were no vitals filed for this visit. There is no height or weight on file to calculate BMI. Constitutional Well developed. Well nourished.  Vascular Foot warm and well perfused. Capillary refill normal to all digits.   Neurologic Normal speech. Oriented to person, place, and time. Epicritic sensation to light touch grossly present bilaterally.  Dermatologic Skin has completely epithelialized.  No signs of dehiscence or skin necrosis noted.  Minimal swelling noted.  He has a semiflexible pes planovalgus foot structure with too many toe signs partially recruit the arch with dorsiflexion of the hallux.  Unable to perform single and double heel raise.  Orthopedic: Mild enderness to palpation noted about the surgical site.   Radiographs: 3 views of skeletally mature the right foot calcaneal axial view: Good correction alignment noted.  Hardware is intact.  Reduction of the posterior facet noted.  Restoration of the hindfoot as well as the posterior facet deficit noted.  Restoration of angle of gissane noted.  No new fracture noted.  Good consolidation noted. Assessment:   1. Closed displaced fracture of body of right calcaneus, initial encounter    Plan:  Patient was evaluated and treated and all  questions answered.  S/p foot surgery right -Progressing as expected post-operatively. -XR: Repeat x-ray shows more consolidation without any displacement. -Patient may discontinue physical therapy at this time his foot function has returned to normal.  He is able to go to regular shoes without any complaints.  At this time I discussed with the patient to make shoe gear modification as well as his wear his orthotics.  If any foot and ankle issues arise in future he can come back and see me.  Pes planovalgus -I explained to patient the etiology of pes planovalgus and relationship with calcaneal fracture/foot structure change and various treatment  options were discussed.  Given patient foot structure in the setting of Planter fasciitis I believe patient will benefit from custom-made orthotics to help control the hindfoot motion support the arch of the foot and take the stress away from plantar fascial.  Patient agrees with the plan like to proceed with orthotics -Orthotics were dispensed and functioning well    No follow-ups on file.

## 2021-04-08 ENCOUNTER — Encounter: Payer: Self-pay | Admitting: Podiatry

## 2021-04-08 ENCOUNTER — Other Ambulatory Visit: Payer: Self-pay

## 2021-04-08 ENCOUNTER — Ambulatory Visit (INDEPENDENT_AMBULATORY_CARE_PROVIDER_SITE_OTHER): Payer: PRIVATE HEALTH INSURANCE | Admitting: Podiatry

## 2021-04-08 DIAGNOSIS — M792 Neuralgia and neuritis, unspecified: Secondary | ICD-10-CM | POA: Diagnosis not present

## 2021-04-08 MED ORDER — LIDOCAINE 5 % EX OINT: 1.0000 "application " | TOPICAL_OINTMENT | CUTANEOUS | 0 refills | Status: DC | PRN

## 2021-04-13 NOTE — Progress Notes (Signed)
Subjective:  Patient ID: Fernando Goodwin, male    DOB: 04-May-1965,  MRN: [id number]    DOS: 11/01/2020 Procedure: Right calcaneal fracture ORIF  56 y.o. male returns for post-op check.  Patient states that he is doing well.  He states doing better.  The weather changes pain he is experiencing is fine.  He is gone back to work he is feeling a lot more in the heel.  He denies any other acute complaints.  Review of Systems: Negative except as noted in the HPI. Denies N/V/F/Ch.  Past Medical History:  Diagnosis Date   Anginal pain (HCC)    Anxiety    Arthritis    Asthma    Family history of adverse reaction to anesthesia    mom had some nausea   Hypertension    Pneumonia    Sleep apnea    no CPAP   Stroke (HCC)    2015    Current Outpatient Medications:    lidocaine (XYLOCAINE) 5 % ointment, Apply 1 application topically as needed., Disp: 35.44 g, Rfl: 0   acetaminophen (TYLENOL) 325 MG tablet, Take 2 tablets (650 mg total) by mouth every 6 (six) hours as needed for mild pain., Disp: , Rfl:    amLODipine (NORVASC) 10 MG tablet, Take 10 mg by mouth daily., Disp: , Rfl:    cetirizine (ZYRTEC) 10 MG tablet, Take 10 mg by mouth daily., Disp: , Rfl:    clopidogrel (PLAVIX) 75 MG tablet, Take 1 tablet (75 mg total) by mouth daily., Disp: 30 tablet, Rfl: 3   Coenzyme Q10 (COQ10 PO), Take 1 capsule by mouth daily., Disp: , Rfl:    ibuprofen (ADVIL) 800 MG tablet, Take 1 tablet (800 mg total) by mouth every 6 (six) hours as needed., Disp: 60 tablet, Rfl: 1   losartan-hydrochlorothiazide (HYZAAR) 100-12.5 MG tablet, Take 1 tablet by mouth daily., Disp: , Rfl:    nitroGLYCERIN (NITROSTAT) 0.4 MG SL tablet, Place 1 tablet (0.4 mg total) under the tongue every 5 (five) minutes as needed for chest pain., Disp: 30 tablet, Rfl: 2   PARoxetine (PAXIL) 20 MG tablet, Take 10 mg by mouth daily., Disp: , Rfl:   Social History   Tobacco Use  Smoking Status Former  Smokeless Tobacco Never     Allergies  Allergen Reactions   Oxycodone Nausea Only    Can tolerate with nausea medications   Objective:  There were no vitals filed for this visit. There is no height or weight on file to calculate BMI. Constitutional Well developed. Well nourished.  Vascular Foot warm and well perfused. Capillary refill normal to all digits.   Neurologic Normal speech. Oriented to person, place, and time. Epicritic sensation to light touch grossly present bilaterally.  Dermatologic Skin has completely epithelialized.  No signs of dehiscence or skin necrosis noted.  Minimal swelling noted.  He has a semiflexible pes planovalgus foot structure with too many toe signs partially recruit the arch with dorsiflexion of the hallux.  Unable to perform single and double heel raise.  Positive neuritis like symptoms were described.  Consistent with postoperative healing  Orthopedic: Mild enderness to palpation noted about the surgical site.   Radiographs: 3 views of skeletally mature the right foot calcaneal axial view: Good correction alignment noted.  Hardware is intact.  Reduction of the posterior facet noted.  Restoration of the hindfoot as well as the posterior facet deficit noted.  Restoration of angle of gissane noted.  No new fracture noted.  Good  consolidation noted. Assessment:   No diagnosis found.  Plan:  Patient was evaluated and treated and all questions answered.  Right neuritis --Lidocaine jelly was dispensed to help with some of the nerve pain  S/p foot surgery right -Progressing as expected post-operatively. -XR: None we will plan on getting it during next visit if it continues to bother him -Patient may discontinue physical therapy at this time his foot function has returned to normal.  He is able to go to regular shoes without any complaints.  At this time I discussed with the patient to make shoe gear modification as well as his wear his orthotics.  If any foot and ankle issues  arise in future he can come back and see me.   Pes planovalgus -I explained to patient the etiology of pes planovalgus and relationship with calcaneal fracture/foot structure change and various treatment options were discussed.  Given patient foot structure in the setting of Planter fasciitis I believe patient will benefit from custom-made orthotics to help control the hindfoot motion support the arch of the foot and take the stress away from plantar fascial.  Patient agrees with the plan like to proceed with orthotics -Orthotics were dispensed and functioning well    No follow-ups on file.

## 2021-04-15 ENCOUNTER — Ambulatory Visit: Payer: PRIVATE HEALTH INSURANCE | Admitting: Podiatry

## 2021-05-26 ENCOUNTER — Ambulatory Visit (INDEPENDENT_AMBULATORY_CARE_PROVIDER_SITE_OTHER): Payer: PRIVATE HEALTH INSURANCE | Admitting: Podiatry

## 2021-05-26 ENCOUNTER — Ambulatory Visit (INDEPENDENT_AMBULATORY_CARE_PROVIDER_SITE_OTHER): Payer: PRIVATE HEALTH INSURANCE

## 2021-05-26 DIAGNOSIS — M778 Other enthesopathies, not elsewhere classified: Secondary | ICD-10-CM

## 2021-05-26 DIAGNOSIS — S92011A Displaced fracture of body of right calcaneus, initial encounter for closed fracture: Secondary | ICD-10-CM

## 2021-05-26 NOTE — Progress Notes (Signed)
?Subjective:  ?Patient ID: Fernando Goodwin, male    DOB: October 07, 1965,  MRN: [id number] ? ? ? ?DOS: 11/01/2020 ?Procedure: Right calcaneal fracture ORIF ? ?56 y.o. male returns for post-op check.  Patient states that he is doing well.  He states doing better.  The weather changes pain he is experiencing is fine.  He states that he is not able to work for a long period of time.  It last for about 3 to 4 hours before he has to sit down and rest.  He states his pain gets worse in the morning. ? ?Review of Systems: Negative except as noted in the HPI. Denies N/V/F/Ch. ? ?Past Medical History:  ?Diagnosis Date  ? Anginal pain ([place])   ? Anxiety   ? Arthritis   ? Asthma   ? Family history of adverse reaction to anesthesia   ? mom had some nausea  ? Hypertension   ? Pneumonia   ? Sleep apnea   ? no CPAP  ? Stroke [facility])   ? 2015  ? ? ?Current Outpatient Medications:  ?  acetaminophen (TYLENOL) 325 MG tablet, Take 2 tablets (650 mg total) by mouth every 6 (six) hours as needed for mild pain., Disp: , Rfl:  ?  amLODipine (NORVASC) 10 MG tablet, Take 10 mg by mouth daily., Disp: , Rfl:  ?  cetirizine (ZYRTEC) 10 MG tablet, Take 10 mg by mouth daily., Disp: , Rfl:  ?  clopidogrel (PLAVIX) 75 MG tablet, Take 1 tablet (75 mg total) by mouth daily., Disp: 30 tablet, Rfl: 3 ?  Coenzyme Q10 (COQ10 PO), Take 1 capsule by mouth daily., Disp: , Rfl:  ?  ibuprofen (ADVIL) 800 MG tablet, Take 1 tablet (800 mg total) by mouth every 6 (six) hours as needed., Disp: 60 tablet, Rfl: 1 ?  lidocaine (XYLOCAINE) 5 % ointment, Apply 1 application topically as needed., Disp: 35.44 g, Rfl: 0 ?  losartan-hydrochlorothiazide (HYZAAR) 100-12.5 MG tablet, Take 1 tablet by mouth daily., Disp: , Rfl:  ?  nitroGLYCERIN (NITROSTAT) 0.4 MG SL tablet, Place 1 tablet (0.4 mg total) under the tongue every 5 (five) minutes as needed for chest pain., Disp: 30 tablet, Rfl: 2 ?  PARoxetine (PAXIL) 20 MG tablet, Take 10 mg by mouth daily., Disp: , Rfl:  ? ?Social  History  ? ?Tobacco Use  ?Smoking Status Former  ?Smokeless Tobacco Never  ? ? ?Allergies  ?Allergen Reactions  ? Oxycodone Nausea Only  ?  Can tolerate with nausea medications  ? ?Objective:  ?There were no vitals filed for this visit. ?There is no height or weight on file to calculate BMI. ?Constitutional Well developed. ?Well nourished.  ?Vascular Foot warm and well perfused. ?Capillary refill normal to all digits.   ?Neurologic Normal speech. ?Oriented to person, place, and time. ?Epicritic sensation to light touch grossly present bilaterally.  ?Dermatologic Skin has completely epithelialized.  No signs of dehiscence or skin necrosis noted.  Minimal swelling noted.  He has a semiflexible pes planovalgus foot structure with too many toe signs partially recruit the arch with dorsiflexion of the hallux.  Unable to perform single and double heel raise. ? ?Pain with range of motion of the subtalar joint.  No pain at the ankle joint or talonavicular joint.  No pain along the course of the hardware  ?Orthopedic: Mild enderness to palpation noted about the surgical site.  ? ?Radiographs: 3 views of skeletally mature the right foot calcaneal axial view: Good correction alignment noted.  Hardware is intact.  Reduction of the posterior facet noted.  Restoration of the hindfoot as well as the posterior facet deficit noted.  Restoration of angle of gissane noted.  No new fracture noted.  Good consolidation noted. ?Assessment:  ? ?1. Capsulitis of right foot   ?2. Closed displaced fracture of body of right calcaneus, initial encounter   ? ? ?Plan:  ?Patient was evaluated and treated and all questions answered. ? ?Right foot capsulitis ?-I explained the patient the etiology of capsulitis numbers treatment options were discussed.  I believe a lot of the pain that he is experiencing may be due to subtalar joint from a previous calcaneal fracture that may lead to some arthritic pain that he is experiencing joint. ?-The pain he is  experiencing I believe benefit from a steroid injection.  Steroid injection was injected into the subtalar joint to give him some relief. ?-A steroid injection was performed at right subtalar joint using 1% plain Lidocaine and 10 mg of Kenalog. This was well tolerated. ? ? ?S/p foot surgery right ?-Progressing as expected post-operatively. ?-XR: None we will plan on getting it during next visit if it continues to bother him ?-Patient may discontinue physical therapy at this time his foot function has returned to normal.  He is able to go to regular shoes without any complaints.  At this time I discussed with the patient to make shoe gear modification as well as his wear his orthotics.  If any foot and ankle issues arise in future he can come back and see me. ? ? ?Pes planovalgus ?-I explained to patient the etiology of pes planovalgus and relationship with calcaneal fracture/foot structure change and various treatment options were discussed.  Given patient foot structure in the setting of Planter fasciitis I believe patient will benefit from custom-made orthotics to help control the hindfoot motion support the arch of the foot and take the stress away from plantar fascial.  Patient agrees with the plan like to proceed with orthotics ?-Orthotics were dispensed and functioning well ? ? ? ?No follow-ups on file.  ? ?

## 2021-07-12 ENCOUNTER — Ambulatory Visit: Payer: PRIVATE HEALTH INSURANCE | Admitting: Podiatry

## 2021-07-21 ENCOUNTER — Ambulatory Visit (INDEPENDENT_AMBULATORY_CARE_PROVIDER_SITE_OTHER): Payer: PRIVATE HEALTH INSURANCE | Admitting: Podiatry

## 2021-07-21 ENCOUNTER — Ambulatory Visit (INDEPENDENT_AMBULATORY_CARE_PROVIDER_SITE_OTHER): Payer: PRIVATE HEALTH INSURANCE

## 2021-07-21 DIAGNOSIS — T8484XA Pain due to internal orthopedic prosthetic devices, implants and grafts, initial encounter: Secondary | ICD-10-CM | POA: Diagnosis not present

## 2021-07-21 DIAGNOSIS — S92011A Displaced fracture of body of right calcaneus, initial encounter for closed fracture: Secondary | ICD-10-CM | POA: Diagnosis not present

## 2021-07-21 DIAGNOSIS — M19071 Primary osteoarthritis, right ankle and foot: Secondary | ICD-10-CM

## 2021-07-21 NOTE — Progress Notes (Signed)
Subjective:  Patient ID: Fernando Goodwin, male    DOB: [birth year],  MRN: [id number]    DOS: 11/01/2020 Procedure: Right calcaneal fracture ORIF  56 y.o. male presents with painful orthopedic hardware versus subtalar joint arthritis.  Patient states thatPatient states that he has some stiffness in the morning and it causes him pain when he has been on his foot for 8 to 10 hours a day.  Overall he has been mainly able to manage the pain he just wants to make sure is not the hardware is causing him some pain.  He has been able to take meloxicam to help with some of the pain.  He wanted to get it evaluated.  He denies any other acute complaints. Review of Systems: Negative except as noted in the HPI. Denies N/V/F/Ch.  Past Medical History:  Diagnosis Date   Anginal pain ([place])    Anxiety    Arthritis    Asthma    Family history of adverse reaction to anesthesia    mom had some nausea   Hypertension    Pneumonia    Sleep apnea    no CPAP   Stroke ([place])    2015    Current Outpatient Medications:    acetaminophen (TYLENOL) 325 MG tablet, Take 2 tablets (650 mg total) by mouth every 6 (six) hours as needed for mild pain., Disp: , Rfl:    amLODipine (NORVASC) 10 MG tablet, Take 10 mg by mouth daily., Disp: , Rfl:    cetirizine (ZYRTEC) 10 MG tablet, Take 10 mg by mouth daily., Disp: , Rfl:    clopidogrel (PLAVIX) 75 MG tablet, Take 1 tablet (75 mg total) by mouth daily., Disp: 30 tablet, Rfl: 3   Coenzyme Q10 (COQ10 PO), Take 1 capsule by mouth daily., Disp: , Rfl:    ibuprofen (ADVIL) 800 MG tablet, Take 1 tablet (800 mg total) by mouth every 6 (six) hours as needed., Disp: 60 tablet, Rfl: 1   lidocaine (XYLOCAINE) 5 % ointment, Apply 1 application topically as needed., Disp: 35.44 g, Rfl: 0   losartan-hydrochlorothiazide (HYZAAR) 100-12.5 MG tablet, Take 1 tablet by mouth daily., Disp: , Rfl:    nitroGLYCERIN (NITROSTAT) 0.4 MG SL tablet, Place 1 tablet (0.4 mg total) under the tongue  every 5 (five) minutes as needed for chest pain., Disp: 30 tablet, Rfl: 2   PARoxetine (PAXIL) 20 MG tablet, Take 10 mg by mouth daily., Disp: , Rfl:   Social History   Tobacco Use  Smoking Status Former  Smokeless Tobacco Never    Allergies  Allergen Reactions   Oxycodone Nausea Only    Can tolerate with nausea medications   Objective:  There were no vitals filed for this visit. There is no height or weight on file to calculate BMI. Constitutional Well developed. Well nourished.  Vascular Foot warm and well perfused. Capillary refill normal to all digits.   Neurologic Normal speech. Oriented to person, place, and time. Epicritic sensation to light touch grossly present bilaterally.  Dermatologic Skin has completely epithelialized.  No signs of dehiscence or skin necrosis noted.  Minimal swelling noted.  He has a semiflexible pes planovalgus foot structure with too many toe signs partially recruit the arch with dorsiflexion of the hallux.  Unable to perform single and double heel raise.  Pain with range of motion of the subtalar joint.  No pain at the ankle joint or talonavicular joint.  Pain along the course of orthopedic hardware.  Orthopedic: Mild enderness to palpation  noted about the surgical site.   Radiographs: 3 views of skeletally mature the right foot calcaneal axial view: Good correction alignment noted.  Hardware is intact.  Reduction of the posterior facet noted.  Restoration of the hindfoot as well as the posterior facet deficit noted.  Restoration of angle of gissane noted.  No new fracture noted.  Good consolidation noted. Assessment:   1. Arthritis of right subtalar joint   2. Closed displaced fracture of body of right calcaneus, initial encounter   3. Painful orthopaedic hardware [facility])     Plan:  Patient was evaluated and treated and all questions answered.  Right foot subtalar joint arthritis versus painful orthopedic hardware -All questions and concerns  were discussed with the patient in extensive detail. -For now we will continue managing the pain and in August we may plan to get a CT scan for hardware removal versus subtalar joint arthritis/surgical fusion of the subtalar joint.  I discussed with the patient he states understanding.   S/p foot surgery right -Progressing as expected post-operatively. -XR: None we will plan on getting it during next visit if it continues to bother him -Patient may discontinue physical therapy at this time his foot function has returned to normal.  He is able to go to regular shoes without any complaints.  At this time I discussed with the patient to make shoe gear modification as well as his wear his orthotics.  If any foot and ankle issues arise in future he can come back and see me.   Pes planovalgus -I explained to patient the etiology of pes planovalgus and relationship with calcaneal fracture/foot structure change and various treatment options were discussed.  Given patient foot structure in the setting of Planter fasciitis I believe patient will benefit from custom-made orthotics to help control the hindfoot motion support the arch of the foot and take the stress away from plantar fascial.  Patient agrees with the plan like to proceed with orthotics -Orthotics were dispensed and functioning well    No follow-ups on file.

## 2021-08-03 ENCOUNTER — Other Ambulatory Visit: Payer: Self-pay | Admitting: Family Medicine

## 2021-08-03 ENCOUNTER — Ambulatory Visit
Admission: RE | Admit: 2021-08-03 | Discharge: 2021-08-03 | Disposition: A | Discharge status: Home / Self Care | Payer: PRIVATE HEALTH INSURANCE | Source: Ambulatory Visit | Attending: Family Medicine | Admitting: Family Medicine

## 2021-08-03 DIAGNOSIS — R1032 Left lower quadrant pain: Secondary | ICD-10-CM

## 2021-08-03 MED ORDER — IOHEXOL 300 MG/ML  SOLN
100.0000 mL | Freq: Once | INTRAMUSCULAR | Status: AC | PRN
  Administered 2021-08-03: 100 mL via INTRAVENOUS

## 2021-08-04 ENCOUNTER — Other Ambulatory Visit: Payer: Self-pay | Admitting: Internal Medicine

## 2021-08-04 DIAGNOSIS — R262 Difficulty in walking, not elsewhere classified: Secondary | ICD-10-CM

## 2021-08-04 DIAGNOSIS — Z9181 History of falling: Secondary | ICD-10-CM

## 2021-08-04 DIAGNOSIS — M4310 Spondylolisthesis, site unspecified: Secondary | ICD-10-CM

## 2021-08-04 DIAGNOSIS — M79604 Pain in right leg: Secondary | ICD-10-CM

## 2021-08-19 ENCOUNTER — Ambulatory Visit: Payer: PRIVATE HEALTH INSURANCE

## 2021-08-31 ENCOUNTER — Telehealth: Payer: Self-pay | Admitting: Podiatry

## 2021-08-31 NOTE — Telephone Encounter (Signed)
Patient came in today he has new insurance that he has to use a certain facility when doing Imaging. Patient states  he needs an MRI and he would like for his order to go to this facility. [facility] --Fax number is [phone] and the address is [address] [place] [address]. If you have any questions please feel free to call patient.

## 2021-09-01 ENCOUNTER — Other Ambulatory Visit: Payer: Self-pay | Admitting: Podiatry

## 2021-09-01 DIAGNOSIS — M19071 Primary osteoarthritis, right ankle and foot: Secondary | ICD-10-CM

## 2021-09-01 DIAGNOSIS — S92011A Displaced fracture of body of right calcaneus, initial encounter for closed fracture: Secondary | ICD-10-CM

## 2021-09-01 NOTE — Telephone Encounter (Signed)
Called pt let him know CT order was sent over.

## 2021-09-06 ENCOUNTER — Other Ambulatory Visit: Payer: Self-pay | Admitting: Podiatry

## 2021-09-06 DIAGNOSIS — M19071 Primary osteoarthritis, right ankle and foot: Secondary | ICD-10-CM

## 2021-09-14 ENCOUNTER — Telehealth: Payer: Self-pay

## 2021-09-14 NOTE — Telephone Encounter (Signed)
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 Upon review his MRI is not scheduled yet and I dont see anything about PT or ESIs. Please get him scheduled to see Duwayne Heck, after his MRI is scheduled.

## 2021-09-15 NOTE — Telephone Encounter (Signed)
Please see Fernando Goodwin message below.

## 2021-09-15 NOTE — Telephone Encounter (Signed)
MRI only that showed a herniated disc. No ESI injections or PT, only seen a chiropractor. He has a herniated disc and is in a lot of pain. He wants to know if it is safe for him to go this long with out treatment? He feels physical therapy might make his symptoms worse. Could Dr.Hande refer him to physiatry in the meantime?

## 2021-09-15 NOTE — Telephone Encounter (Signed)
Appt with Danielle on 10/13/2021.

## 2021-09-16 NOTE — Telephone Encounter (Signed)
Faxed information to Dr.Hande to see if his office would be willing to order PT and injections in the mean time.

## 2021-09-19 NOTE — Telephone Encounter (Signed)
Fax received, they have placed a order for Physiatry

## 2021-09-22 ENCOUNTER — Ambulatory Visit (INDEPENDENT_AMBULATORY_CARE_PROVIDER_SITE_OTHER): Payer: PRIVATE HEALTH INSURANCE | Admitting: Podiatry

## 2021-09-22 DIAGNOSIS — Z01818 Encounter for other preprocedural examination: Secondary | ICD-10-CM | POA: Diagnosis not present

## 2021-09-22 DIAGNOSIS — T8484XA Pain due to internal orthopedic prosthetic devices, implants and grafts, initial encounter: Secondary | ICD-10-CM | POA: Diagnosis not present

## 2021-09-22 DIAGNOSIS — M19071 Primary osteoarthritis, right ankle and foot: Secondary | ICD-10-CM

## 2021-09-26 NOTE — Telephone Encounter (Signed)
Noted  

## 2021-09-29 NOTE — Progress Notes (Signed)
Subjective:  Patient ID: Fernando Goodwin, male    DOB: Feb 22, 1965,  MRN: [id number]    DOS: 11/01/2020 Procedure: Right calcaneal fracture ORIF  56 y.o. male presents with painful orthopedic hardware as well as subtalar joint arthritis arthritic pain.  He states he still continues to bother him has not gotten any better.  At this time he would like to discuss surgical options he had a CT scan done at [facility].  And he has brought the CD in with him Review of Systems: Negative except as noted in the HPI. Denies N/V/F/Ch.  Past Medical History:  Diagnosis Date   Anginal pain (HCC)    Anxiety    Arthritis    Asthma    Family history of adverse reaction to anesthesia    mom had some nausea   Hypertension    Pneumonia    Sleep apnea    no CPAP   Stroke (HCC)    2015    Current Outpatient Medications:    acetaminophen (TYLENOL) 325 MG tablet, Take 2 tablets (650 mg total) by mouth every 6 (six) hours as needed for mild pain., Disp: , Rfl:    amLODipine (NORVASC) 10 MG tablet, Take 10 mg by mouth daily., Disp: , Rfl:    cetirizine (ZYRTEC) 10 MG tablet, Take 10 mg by mouth daily., Disp: , Rfl:    clopidogrel (PLAVIX) 75 MG tablet, Take 1 tablet (75 mg total) by mouth daily., Disp: 30 tablet, Rfl: 3   Coenzyme Q10 (COQ10 PO), Take 1 capsule by mouth daily., Disp: , Rfl:    ibuprofen (ADVIL) 800 MG tablet, Take 1 tablet (800 mg total) by mouth every 6 (six) hours as needed., Disp: 60 tablet, Rfl: 1   lidocaine (XYLOCAINE) 5 % ointment, Apply 1 application topically as needed., Disp: 35.44 g, Rfl: 0   losartan-hydrochlorothiazide (HYZAAR) 100-12.5 MG tablet, Take 1 tablet by mouth daily., Disp: , Rfl:    nitroGLYCERIN (NITROSTAT) 0.4 MG SL tablet, Place 1 tablet (0.4 mg total) under the tongue every 5 (five) minutes as needed for chest pain., Disp: 30 tablet, Rfl: 2   PARoxetine (PAXIL) 20 MG tablet, Take 10 mg by mouth daily., Disp: , Rfl:   Social History   Tobacco Use  Smoking  Status Former  Smokeless Tobacco Never    Allergies  Allergen Reactions   Oxycodone Nausea Only    Can tolerate with nausea medications   Objective:  There were no vitals filed for this visit. There is no height or weight on file to calculate BMI. Constitutional Well developed. Well nourished.  Vascular Foot warm and well perfused. Capillary refill normal to all digits.   Neurologic Normal speech. Oriented to person, place, and time. Epicritic sensation to light touch grossly present bilaterally.  Dermatologic   Pain with range of motion of the subtalar joint.  Some crepitus clinically appreciated.  No pain at the ankle joint or talonavicular joint.  Pain along the course of orthopedic hardware.  Orthopedic: Mild enderness to palpation noted about the surgical site.   CT scan from [facility]  IMPRESSION:  1.  Comminuted fractures of the calcaneus with multiple fracture lines extending into the posterior calcaneal facet as well as approximately 9 mm of step-off between the posterior lateral posterior medial compartments.  2.  Interval healing of fracture lines of the calcaneus status post ORIF with screw extending into the posterior medial aspect of the posterior talocalcaneal joint.  3.  Large plantar and moderate posterior  calcaneal spurs.  4.  Thickening and relative lateral subluxation of the peroneal tendons about the distal tip of the fibula raising the possibility for previous superior retinacular injury with spurring of the distal posterior lateral malleolus likely associated with chronic peroneal tenosynovitis.  Assessment:   1. Arthritis of right subtalar joint   2. Painful orthopaedic hardware (HCC)   3. Encounter for preoperative examination for general surgical procedure      Plan:  Patient was evaluated and treated and all questions answered.  Right foot subtalar joint arthritis versus painful orthopedic hardware -All questions and concerns were discussed  with the patient in extensive detail. -CT scan was reviewed which shows some signs of healing however mostly there is lot of arthritis.  CT scan was reviewed and it shows still some combination of the fracture and a posterior calcaneal facet.  This may also be causing him a lot of pain.  He is also having pain from orthopedic hardware as well as he is able to feel the hardware.  At this time I discussed with the patient that given the amount of pain that he is experiencing he will benefit from removal of hardware and fusion of the subtalar joint.  I discussed with the patient in extensive detail he states understand would like to proceed with that. -Given the severe comminution of the still present with some signs of healing however in the setting of continuous pain and almost a year I believe she will be he will benefit from subtalar joint fusion with fixation with possible bone graft I discussed with patient I discussed my preoperative intraoperative postop plan extensive detail he states understanding and would like to proceed with surgery -Informed surgical risk consent was reviewed and read aloud to the patient.  I reviewed the films.  I have discussed my findings with the patient in great detail.  I have discussed all risks including but not limited to infection, stiffness, scarring, limp, disability, deformity, damage to blood vessels and nerves, numbness, poor healing, need for braces, arthritis, chronic pain, amputation, death.  All benefits and realistic expectations discussed in great detail.  I have made no promises as to the outcome.  I have provided realistic expectations.  I have offered the patient a 2nd opinion, which they have declined and assured me they preferred to proceed despite the risks    S/p foot surgery right -Progressing as expected post-operatively. -XR: None we will plan on getting it during next visit if it continues to bother him -Patient may discontinue physical therapy at  this time his foot function has returned to normal.  He is able to go to regular shoes without any complaints.  At this time I discussed with the patient to make shoe gear modification as well as his wear his orthotics.  If any foot and ankle issues arise in future he can come back and see me.   Pes planovalgus -I explained to patient the etiology of pes planovalgus and relationship with calcaneal fracture/foot structure change and various treatment options were discussed.  Given patient foot structure in the setting of Planter fasciitis I believe patient will benefit from custom-made orthotics to help control the hindfoot motion support the arch of the foot and take the stress away from plantar fascial.  Patient agrees with the plan like to proceed with orthotics -Orthotics were dispensed and functioning well    No follow-ups on file.

## 2021-10-03 ENCOUNTER — Other Ambulatory Visit: Payer: Self-pay

## 2021-10-03 ENCOUNTER — Ambulatory Visit
Admission: RE | Admit: 2021-10-03 | Discharge: 2021-10-03 | Disposition: A | Discharge status: Home / Self Care | Payer: Self-pay | Source: Ambulatory Visit | Attending: Neurosurgery | Admitting: Neurosurgery

## 2021-10-03 DIAGNOSIS — Z049 Encounter for examination and observation for unspecified reason: Secondary | ICD-10-CM

## 2021-10-13 ENCOUNTER — Encounter: Payer: Self-pay | Admitting: Neurosurgery

## 2021-10-13 ENCOUNTER — Ambulatory Visit (INDEPENDENT_AMBULATORY_CARE_PROVIDER_SITE_OTHER): Payer: PRIVATE HEALTH INSURANCE | Admitting: Neurosurgery

## 2021-10-13 ENCOUNTER — Telehealth: Payer: Self-pay | Admitting: Urology

## 2021-10-13 VITALS — BP 136/82 | Ht 69.0 in | Wt 211.6 lb

## 2021-10-13 DIAGNOSIS — M5442 Lumbago with sciatica, left side: Secondary | ICD-10-CM

## 2021-10-13 DIAGNOSIS — M5416 Radiculopathy, lumbar region: Secondary | ICD-10-CM | POA: Diagnosis not present

## 2021-10-13 DIAGNOSIS — M5441 Lumbago with sciatica, right side: Secondary | ICD-10-CM | POA: Diagnosis not present

## 2021-10-13 DIAGNOSIS — M47819 Spondylosis without myelopathy or radiculopathy, site unspecified: Secondary | ICD-10-CM

## 2021-10-13 DIAGNOSIS — G8929 Other chronic pain: Secondary | ICD-10-CM

## 2021-10-13 NOTE — Progress Notes (Signed)
Referring Physician:  Barbette Reichmann, MD [address] [facility] [place],  [place] [address]  Primary Physician:  Barbette Reichmann, MD  History of Present Illness: 10/13/2021 Fernando Goodwin is a 56 y.o with a history of HTN, CAD on Plavix, and a right calcaneal fracture in September 2022 resulting in surgery who is here today with a chief complaint of back and bilateral leg pain.  He states that this started after his fall in September of last year and has progressively worsened since then.  He describes bilateral buttock pain that radiates down his posterior thighs slightly worse on the left than the right.  This seems to be worse particularly with getting up from a seated position after sitting for prolonged period of time.  He describes the pain as shooting without extension beyond his knees.  He denies any associated numbness although he does experience some tingling.  He is currently scheduled for an epidural steroid injection tomorrow 10/14/2021 with Dr.Morales.  And is waiting to schedule physical therapy as he was told to schedule through a nurse associated with in his insurance to ensure it is covered.  He denies any significant lower extremity weakness. Of note he is scheduled for a surgical procedure on 11/07/21 with Dr. Allena Katz in [place] for his foot.  Conservative measures:  Physical therapy: awaiting scheduling for this Multimodal medical therapy including regular antiinflammatories: Meloxicam Injections: no epidural steroid injections but schedule for 10/14/21  Past Surgery: no previous lumbar spine surgeries  Fernando Goodwin has no symptoms of cervical myelopathy.  The symptoms are causing a significant impact on the patient's life.   Review of Systems:  A 10 point review of systems is negative, except for the pertinent positives and negatives detailed in the HPI.  Past Medical History: Past Medical History:  Diagnosis Date   Anginal pain (HCC)     Anxiety    Arthritis    Asthma    Family history of adverse reaction to anesthesia    mom had some nausea   Hypertension    Pneumonia    Sleep apnea    no CPAP   Stroke (HCC)    2015    Past Surgical History: Past Surgical History:  Procedure Laterality Date   FRACTURE SURGERY     fractured heel- right   HERNIA REPAIR     SHOULDER ARTHROSCOPY WITH SUBACROMIAL DECOMPRESSION, ROTATOR CUFF REPAIR AND BICEP TENDON REPAIR Right 01/25/2021   Procedure: SHOULDER ARTHROSCOPY WITH DEBRIDEMENT, DECOMPRESSION, BICEPS TENODESIS;  Surgeon: Christena Flake, MD;  Location: [facility] ORS;  Service: Orthopedics;  Laterality: Right;    Allergies: Allergies as of 10/13/2021 - Review Complete 10/13/2021  Allergen Reaction Noted   Oxycodone Nausea Only 01/20/2021    Medications: Outpatient Encounter Medications as of 10/13/2021  Medication Sig   amLODipine (NORVASC) 10 MG tablet Take 10 mg by mouth daily.   cetirizine (ZYRTEC) 10 MG tablet Take 10 mg by mouth daily.   clopidogrel (PLAVIX) 75 MG tablet Take 1 tablet (75 mg total) by mouth daily.   Coenzyme Q10 (COQ10 PO) Take 1 capsule by mouth daily.   losartan-hydrochlorothiazide (HYZAAR) 100-12.5 MG tablet Take 1 tablet by mouth daily.   meloxicam (MOBIC) 15 MG tablet Take 15 mg by mouth daily.   nitroGLYCERIN (NITROSTAT) 0.4 MG SL tablet Place 1 tablet (0.4 mg total) under the tongue every 5 (five) minutes as needed for chest pain.   PARoxetine (PAXIL) 20 MG tablet Take 10 mg by mouth  daily.   [DISCONTINUED] acetaminophen (TYLENOL) 325 MG tablet Take 2 tablets (650 mg total) by mouth every 6 (six) hours as needed for mild pain. (Patient not taking: Reported on 10/13/2021)   [DISCONTINUED] ibuprofen (ADVIL) 800 MG tablet Take 1 tablet (800 mg total) by mouth every 6 (six) hours as needed. (Patient not taking: Reported on 10/13/2021)   [DISCONTINUED] lidocaine (XYLOCAINE) 5 % ointment Apply 1 application topically as needed. (Patient not taking:  Reported on 10/13/2021)   No facility-administered encounter medications on file as of 10/13/2021.    Social History: Social History   Tobacco Use   Smoking status: Former   Smokeless tobacco: Never  Substance Use Topics   Alcohol use: Not Currently   Drug use: No    Family Medical History: Family History  Problem Relation Age of Onset   Cancer Mother        Breast   Dementia Father    Hypertension Father     Physical Examination:  Today's Vitals   10/13/21 0954  BP: 136/82  Weight: 96 kg  Height: 5\' 9"  (1.753 m)  PainSc: 6   PainLoc: Back   Body mass index is 31.25 kg/m.   General: Patient is well developed, well nourished, calm, collected, and in no apparent distress. Attention to examination is appropriate.  Psychiatric: Patient is non-anxious.  Head:  Pupils equal, round, and reactive to light.  ENT:  Oral mucosa appears well hydrated.  Neck:   Supple.    Respiratory: Patient is breathing without any difficulty.  Extremities: No edema.  Vascular: Palpable dorsal pedal pulses.  Skin:   On exposed skin, there are no abnormal skin lesions.  NEUROLOGICAL:     Awake, alert, oriented to person, place, and time.  Speech is clear and fluent. Fund of knowledge is appropriate.   Cranial Nerves: Pupils equal round and reactive to light.  Facial tone is symmetric.  ROM of spine: limited extension of the lumbar spine  Palpation of spine: non tender.    Strength: Side Biceps Triceps Deltoid Interossei Grip Wrist Ext. Wrist Flex.  R 5 5 5 5 5 5 5   L 5 5 5 5 5 5 5    Side Iliopsoas Quads Hamstring PF DF EHL  R 5 5 5 5 5 5   L 5 5 5 5 5 5    Reflexes are 1+ and symmetric at the biceps, triceps, brachioradialis, patella and achilles.   Hoffman's is absent.  Clonus is not present.  Toes are down-going.  Bilateral upper and lower extremity sensation is intact to light touch.    Ambulates with an antalgic gait  Medical Decision Making  Imaging: MRI L spine  09/02/21 FINDINGS: For the purposes of this report, it is assumed that the most caudal relatively fully-segmented lumbar-type vertebra is L5, and that the most caudal fully-developed intervertebral disc is L5-S1. There is partial sacralization of L5 on the right.   Degenerative disc disease (DDD) and facet arthropathy (see below).   Vertebral alignment: Minimal retrolisthesis of L2 on L3 and L3 on L4.   Marrow signal: No significant abnormality.  Vertebral body heights: Normal.  Conus medullaris: Terminates at a normal level. Normal in size and signal intensity.   Individual disc levels:   T12-L1 (sagittal images only): Normal.   L1-2 (sagittal images only): Normal.   L2-3: Moderate DDD. Moderate bilateral facet arthropathy. Minimal retrolisthesis. Moderate central zone stenosis. Moderately severe bilateral subarticular zone stenosis. Mild bilateral neural foraminal stenosis.   L3-4: Mild to  moderate DDD. Moderate bilateral facet arthropathy. Minimal retrolisthesis. Mild central zone stenosis. Moderate bilateral subarticular zone stenosis. Moderately severe left neural foraminal stenosis. Moderate right neural foraminal stenosis.   L4-5: Mild DDD. Moderately severe bilateral facet arthropathy. I cannot exclude a small left subarticular zone disc extrusion with mild caudal migration, although this site is not well defined. Mild central zone stenosis. Severe left subarticular zone stenosis. Moderate right subarticular zone stenosis. Moderately severe bilateral neural foraminal stenosis.   L5-S1: Partial sacralization of L5 on the right. Mild left neural foraminal stenosis. No significant spinal canal or right neural foraminal stenosis.   I have personally reviewed the images and agree with the above interpretation.  Assessment and Plan: Fernando Goodwin is a pleasant 56 y.o. male with about 1 year of low back and bilateral leg pain that started after a fall at home in September 2022.  His MRI does  show bilateral foraminal stenosis at L4-5 and facet arthropathy at this level which certainly may explain his symptoms.  We discussed this and further plan of care.  He would like to move forward with physical therapy and will work to get this scheduled.  I did encourage him to contact his orthopedic surgeon regarding his pending injection tomorrow to make sure that this would not delay his September surgery.  We will see him back in about 8 weeks via telephone visit to discuss his progress with therapy and injections.  Should his symptoms continue and he has completed these 2 things it may be worthwhile to have him see Dr. Myer Haff to discuss possible surgical options.  He was encouraged to call our office in the interim with any questions or concerns.  He expressed understanding and was in agreement with this plan.  Thank you for involving me in the care of this patient.   I spent a total of 33 minutes in both face-to-face and non-face-to-face activities for this visit on the date of this encounter including review of records, review of imaging, discussion of symptoms, physical exam, discussion of treatment options, and documentation.   [facility]

## 2021-10-13 NOTE — Telephone Encounter (Signed)
DOS - 11/07/21  REMOVAL FIXATION RIGHT --- [address] [facility] ARTHRODESIS RIGHT --- 50569  RECEIVED FAX FROM [employer] STATING THAT FOR CPT CODES 79480 AND 28725 HAVE BEEN APPROVED, AUTH # [id number] GOOD FROM 11/07/21 - 01/10/22.

## 2021-11-03 ENCOUNTER — Telehealth: Payer: Self-pay

## 2021-11-03 NOTE — Telephone Encounter (Signed)
Fernando Goodwin called to cancel his surgery with Dr. Allena Katz (Dr. Stacie Acres assist) on 11/07/2021. He stated he would have to pay the surgery center $3800.00 and he doesn't have that right now. He will call me back to reschedule. Notified Dr. Allena Katz, Dr. Stacie Acres and Aram Beecham with [facility].

## 2021-11-15 ENCOUNTER — Encounter: Payer: PRIVATE HEALTH INSURANCE | Admitting: Podiatry

## 2021-11-29 ENCOUNTER — Telehealth: Payer: Self-pay

## 2021-11-29 ENCOUNTER — Encounter: Payer: PRIVATE HEALTH INSURANCE | Admitting: Podiatry

## 2021-11-29 NOTE — Telephone Encounter (Signed)
-----   Message from Peggyann Shoals sent at 11/29/2021  1:12 PM EDT ----- Regarding: severe pain Contact: [phone] He is having severe pain. The pain is increasing slowly everyday. Meloxicam is the only thing that his helping some now. He feels like someone is ripping at his back. He can't sit or stand for a long period of time. He can not get into PT until the second week of November. He spoke to his insurance to ask if he can bi-pass PT and he was told the doctor's office has to call them. Can you help him get surgery with out PT.

## 2021-11-29 NOTE — Telephone Encounter (Signed)
We are not able to bypass this insurance requirement. He would need to do 6 weeks of PT or be released by PT in writing prior to any approval of surgery.   As for his medication, he is on plavix so I recommend he take OTC tylenol as instructed on the bottle. He should discuss taking any other NSAIDs with his PCP.

## 2021-11-30 NOTE — Telephone Encounter (Signed)
I attempted to reach Fernando Goodwin.I left a message that I will contact him via mychart and he can message [place] back or call us tomorrow if he has any questions.

## 2021-12-14 NOTE — Progress Notes (Unsigned)
   Telephone Visit- Progress Note: Referring Physician:  Tracie Harrier, MD [address] [facility] [place],  [place] [address]  Primary Physician:  Tracie Harrier, MD  This visit was performed via telephone.  Patient location: home Provider location: office  I spent a total of 10 minutes non-face-to-face activities for this visit on the date of this encounter including review of current clinical condition and response to treatment.    Chief Complaint:  f/u LBP and bilateral leg pain  History of Present Illness: Fernando Goodwin is a 56 y.o. male was last seen by Danielle on 10/13/21 for back and bilateral leg pain. 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   He had improvement with ESI on 10/14/21. No further ESI scheduled as he was scheduled for foot surgery on 11/07/21- this was cancelled as he his insurance wants him to get a second opinion. He was to contact [facility] to schedule another injection.   He was sent to PT as well - he has not started it. He continues with constant LBP and bilateral leg pain. He has some relief with mobic- makes his pain bearable. He was told by his PCP to not get any further lumbar ESIs.   History of HTN, CAD on Plavix, and a right calcaneal fracture in September 2022 resulting in surgery. PCP has him on mobic.   Conservative measures:  Physical therapy: No Multimodal medical therapy including regular antiinflammatories: Meloxicam Injections:  Bilateral L5-S1 TF ESI 10/14/21 Fernando Goodwin)   Past Surgery: no previous lumbar spine surgeries  Exam: No exam done as this was a telephone encounter.     Imaging: Nothing new to review.   Assessment and Plan: Fernando Goodwin is a pleasant 56 y.o. male with constant back and bilateral leg pain.   He has known diffuse lumbar DDD/spondylosis with facet hypertrophy and moderate/severe bilateral foraminal stenosis at  L4-L5. Had short term relief with ESI. No PT.   Treatment options discussed with patient and following plan made:   - Discussed and recommended repeat ESI when able from foot standpoint. He does not want to do any further injections.  - Recommend PT for lumbar spine. He will check with his insurance to find out which location is in network. Will call him Monday to check.  - Surgery on his foot is pending and this would limit his PT.  - He will call after his foot surgery to schedule follow up.   Fernando Boot PA-C Neurosurgery

## 2021-12-15 ENCOUNTER — Ambulatory Visit (INDEPENDENT_AMBULATORY_CARE_PROVIDER_SITE_OTHER): Payer: PRIVATE HEALTH INSURANCE | Admitting: Orthopedic Surgery

## 2021-12-15 DIAGNOSIS — M5441 Lumbago with sciatica, right side: Secondary | ICD-10-CM

## 2021-12-15 DIAGNOSIS — G8929 Other chronic pain: Secondary | ICD-10-CM | POA: Diagnosis not present

## 2021-12-15 DIAGNOSIS — M5442 Lumbago with sciatica, left side: Secondary | ICD-10-CM | POA: Diagnosis not present

## 2021-12-15 DIAGNOSIS — M47816 Spondylosis without myelopathy or radiculopathy, lumbar region: Secondary | ICD-10-CM

## 2022-01-20 ENCOUNTER — Telehealth: Payer: Self-pay

## 2022-01-20 NOTE — Telephone Encounter (Signed)
Pt dropped off PT note. Please see PT note to determine next step. Thanks

## 2022-01-20 NOTE — Telephone Encounter (Signed)
Recommend he see Dr. Myer Haff to discuss possible surgery options.

## 2022-01-23 NOTE — Telephone Encounter (Signed)
He confirmed appt for 01/24/22.

## 2022-01-23 NOTE — Progress Notes (Unsigned)
Referring Physician:  Barbette Reichmann, MD [address] [facility] [place],  [place] [address]  Primary Physician:  Barbette Reichmann, MD  History of Present Illness: 01/23/2022 Mr. Fernando Goodwin is here today with a chief complaint of back pain as well as bilateral leg pain.  It occasionally goes down the back of his thighs into his calves.  He feels like ice is running down his legs.  His legs go numb at times.  He has trouble walking because the pain on his anterior and posterior thighs.  He has substantial back pain.  He had a major trauma in September 2022 where he fell from height and broke his right calcaneus.  He is scheduled for foot surgery at the end of this month.  His pain is worse after getting up from a seated position.  It is made better by meloxicam.   Bowel/Bladder Dysfunction: none  Conservative measures:  Physical therapy: initial eval end of October, saw back on 01/18/22 and he regressed (note in chart). PT recommended to stop PT.   Multimodal medical therapy including regular antiinflammatories: mobic  Injections:  L5-S1 TF ESI 10/14/21 Mariah Milling)- only short term relief  Past Surgery: no lumbar spine surgery  Fernando Goodwin has no symptoms of cervical myelopathy.  The symptoms are causing a significant impact on the patient's life.   I have utilized the care everywhere function in epic to review the outside records available from external health systems.  Initial HPI by Drake Leach, PA-C on 12/15/21: Fernando Goodwin is a 56 y.o. male was last seen by Danielle on 10/13/21 for back and bilateral leg pain. 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    He had improvement with ESI on 10/14/21. No further ESI scheduled as he was scheduled for foot surgery on 11/07/21- this was cancelled as he his insurance wants him to get a second opinion. He was to contact PMR to  schedule another injection.    He was sent to PT as well - he has not started it. He continues with constant LBP and bilateral leg pain. He has some relief with mobic- makes his pain bearable. He was told by his PCP to not get any further lumbar ESIs.    History of HTN, CAD on Plavix, and a right calcaneal fracture in September 2022 resulting in surgery. PCP has him on mobic.   Review of Systems:  A 10 point review of systems is negative, except for the pertinent positives and negatives detailed in the HPI.  Past Medical History: Past Medical History:  Diagnosis Date   Anginal pain (HCC)    Anxiety    Arthritis    Asthma    Family history of adverse reaction to anesthesia    mom had some nausea   Hypertension    Pneumonia    Sleep apnea    no CPAP   Stroke (HCC)    2015    Past Surgical History: Past Surgical History:  Procedure Laterality Date   FRACTURE SURGERY     fractured heel- right   HERNIA REPAIR     SHOULDER ARTHROSCOPY WITH SUBACROMIAL DECOMPRESSION, ROTATOR CUFF REPAIR AND BICEP TENDON REPAIR Right 01/25/2021   Procedure: SHOULDER ARTHROSCOPY WITH DEBRIDEMENT, DECOMPRESSION, BICEPS TENODESIS;  Surgeon: Christena Flake, MD;  Location: [facility] ORS;  Service: Orthopedics;  Laterality: Right;    Allergies: Allergies as of 01/24/2022 - Review Complete 12/15/2021  Allergen  Reaction Noted   Oxycodone Nausea Only 01/20/2021    Medications: No outpatient medications have been marked as taking for the 01/24/22 encounter (Appointment) with Venetia Night, MD.    Social History: Social History   Tobacco Use   Smoking status: Former   Smokeless tobacco: Never  Substance Use Topics   Alcohol use: Not Currently   Drug use: No    Family Medical History: Family History  Problem Relation Age of Onset   Cancer Mother        Breast   Dementia Father    Hypertension Father     Physical Examination: There were no vitals filed for this visit.  General: Patient is  well developed, well nourished, calm, collected, and in no apparent distress. Attention to examination is appropriate.  Neck:   Supple.  Full range of motion.  Respiratory: Patient is breathing without any difficulty.   NEUROLOGICAL:     Awake, alert, oriented to person, place, and time.  Speech is clear and fluent. Fund of knowledge is appropriate.   Cranial Nerves: Pupils equal round and reactive to light.  Facial tone is symmetric.  Facial sensation is symmetric. Shoulder shrug is symmetric. Tongue protrusion is midline.  There is no pronator drift.  ROM of spine: full.    Strength: Side Biceps Triceps Deltoid Interossei Grip Wrist Ext. Wrist Flex.  R 5 5 5 5 5 5 5   L 5 5 5 5 5 5 5    Side Iliopsoas Quads Hamstring PF DF EHL  R 5 5 5 5 5 5   L 5 5 5 5 5 5    Reflexes are 1+ and symmetric at the biceps, triceps, brachioradialis, patella and achilles.   Hoffman's is absent.   Bilateral upper and lower extremity sensation is intact to light touch.    No evidence of dysmetria noted.  Gait is antalgic.     Medical Decision Making  Imaging: MRI L spine 09/02/2021 L1-2 (sagittal images only): Normal.   L2-3: Moderate DDD. Moderate bilateral facet arthropathy. Minimal retrolisthesis. Moderate central zone stenosis. Moderately severe bilateral subarticular zone stenosis. Mild bilateral neural foraminal stenosis.   L3-4: Mild to moderate DDD. Moderate bilateral facet arthropathy. Minimal retrolisthesis. Mild central zone stenosis. Moderate bilateral subarticular zone stenosis. Moderately severe left neural foraminal stenosis. Moderate right neural foraminal stenosis.   L4-5: Mild DDD. Moderately severe bilateral facet arthropathy. I cannot exclude a small left subarticular zone disc extrusion with mild caudal migration, although this site is not well defined. Mild central zone stenosis. Severe left subarticular zone stenosis. Moderate right subarticular zone stenosis. Moderately severe  bilateral neural foraminal stenosis.   L5-S1: Partial sacralization of L5 on the right. Mild left neural foraminal stenosis. No significant spinal canal or right neural foraminal stenosis.   Other: Exophytic ovoid left upper pole renal cystic lesion (approximately 7 cm long axis).   I have personally reviewed the images and agree with the above interpretation.  Assessment and Plan: Fernando Goodwin is a pleasant 56 y.o. male with back pain with neurogenic claudication.  He has substantial spondylosis on his MRI scan, but no evidence of spinal malalignment or instability.  We reviewed possibilities for him.  I do think he has a component of neurogenic claudication that would improve with decompression.  His other option would be to consider medial branch blocks for possible radiofrequency ablation.  If none of this works, 1 could consider spinal cord stimulation.  I recommended lumbar decompression, but asked that he think about it.  He will also reach out to his orthopedist office to see how long he should wait after that surgery to consider L2-5 decompression.    Thank you for involving me in the care of this patient.      Yael Angerer K. Myer Haff MD, [facility] Neurosurgery

## 2022-01-24 ENCOUNTER — Ambulatory Visit (INDEPENDENT_AMBULATORY_CARE_PROVIDER_SITE_OTHER): Payer: PRIVATE HEALTH INSURANCE | Admitting: Neurosurgery

## 2022-01-24 ENCOUNTER — Encounter: Payer: Self-pay | Admitting: Neurosurgery

## 2022-01-24 VITALS — BP 139/90 | HR 70 | Ht 68.0 in | Wt 217.6 lb

## 2022-01-24 DIAGNOSIS — M48062 Spinal stenosis, lumbar region with neurogenic claudication: Secondary | ICD-10-CM

## 2022-01-24 DIAGNOSIS — M5441 Lumbago with sciatica, right side: Secondary | ICD-10-CM | POA: Diagnosis not present

## 2022-01-24 DIAGNOSIS — G8929 Other chronic pain: Secondary | ICD-10-CM | POA: Diagnosis not present

## 2022-01-24 DIAGNOSIS — M5442 Lumbago with sciatica, left side: Secondary | ICD-10-CM

## 2022-04-10 LAB — COLOGUARD: COLOGUARD: NEGATIVE

## 2022-04-10 LAB — EXTERNAL GENERIC LAB PROCEDURE: COLOGUARD: NEGATIVE

## 2022-09-19 IMAGING — CT CT ABD-PELV W/ CM
2 of 5 series · 16 of 46 positions shown, 18 images · IV contrast (APPLIED)
Comparison: 08/05/2015

CLINICAL DATA: Left lower quadrant abdominal pain.

EXAM:
CT ABDOMEN AND PELVIS WITH CONTRAST
TECHNIQUE: Multidetector CT imaging of the abdomen and pelvis was performed
using the standard protocol following bolus administration of
intravenous contrast.

[Series 2: routine abd/pel with · axial · 0.80mm/px · z∈[-560,-85]mm · 13 of 107 slices shown, 15 images]
[im 6/107  soft-tissue]
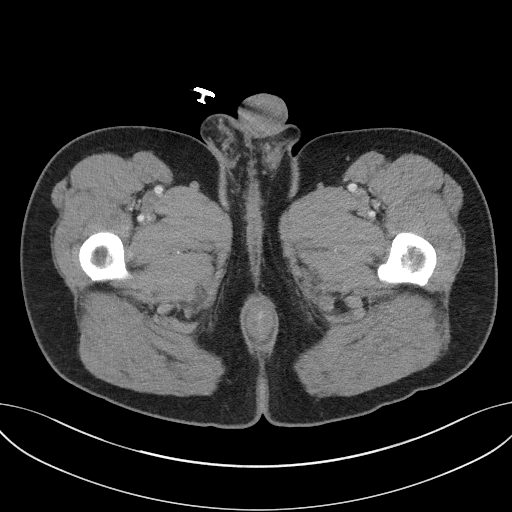
[im 6/107  bone]
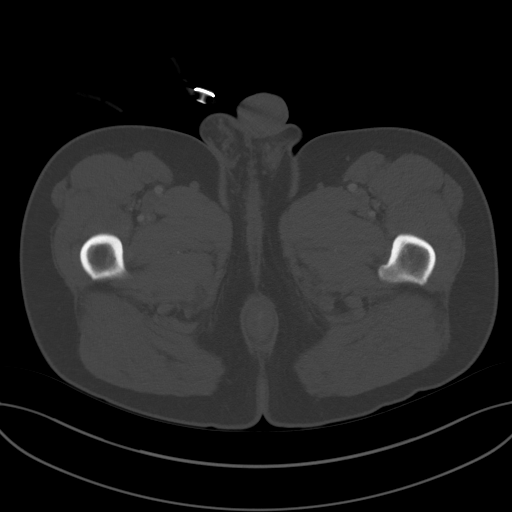
[im 16/107  soft-tissue]
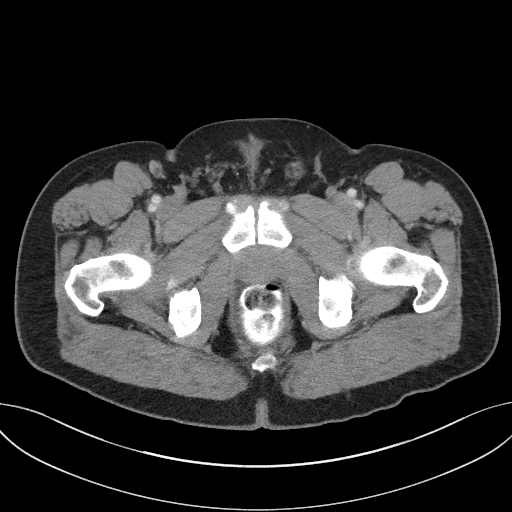
[im 22/107  soft-tissue]
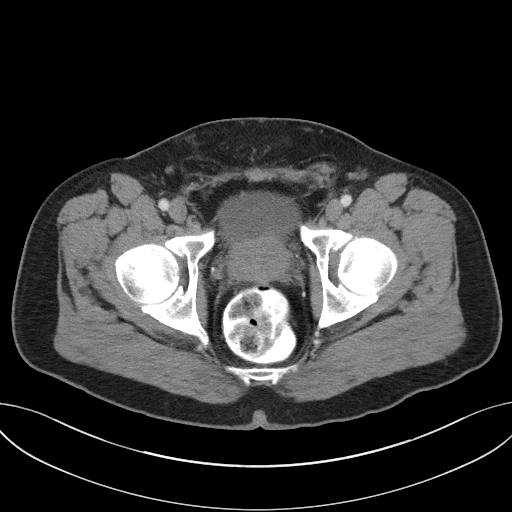
[im 32/107  soft-tissue]
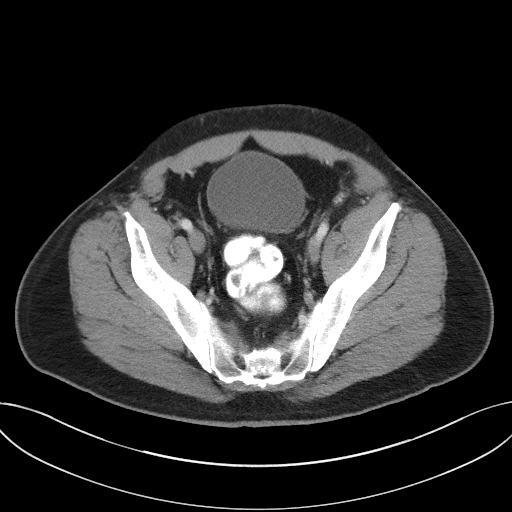
[im 38/107  soft-tissue]
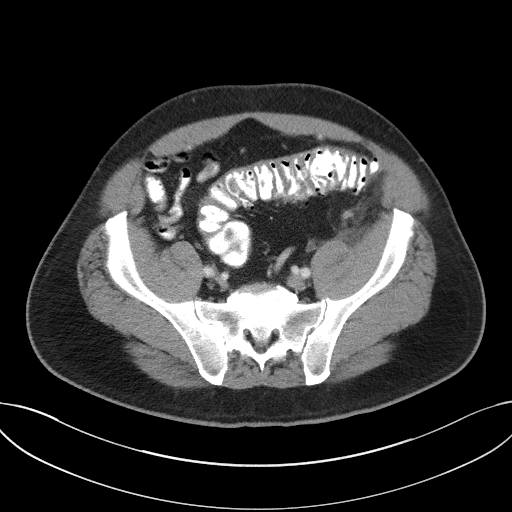
[im 48/107  soft-tissue]
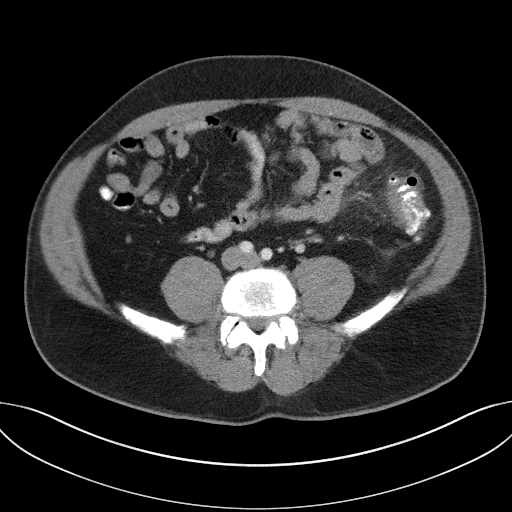
[im 54/107  soft-tissue]
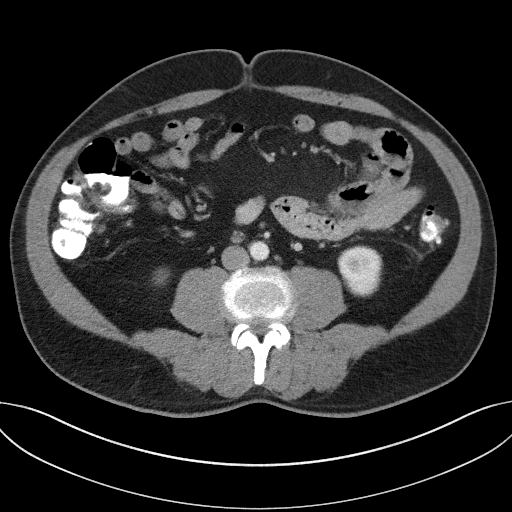
[im 59/107  soft-tissue]
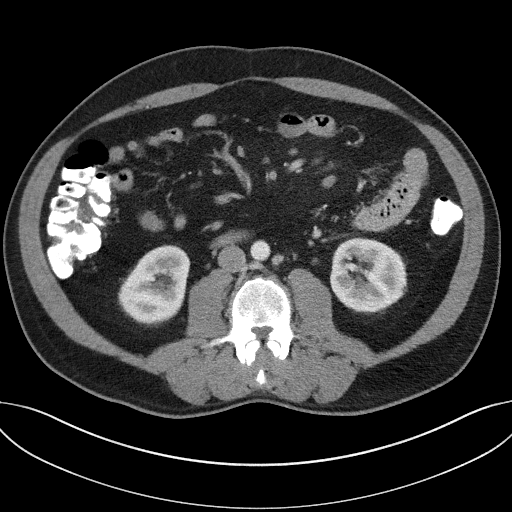
[im 69/107  soft-tissue]
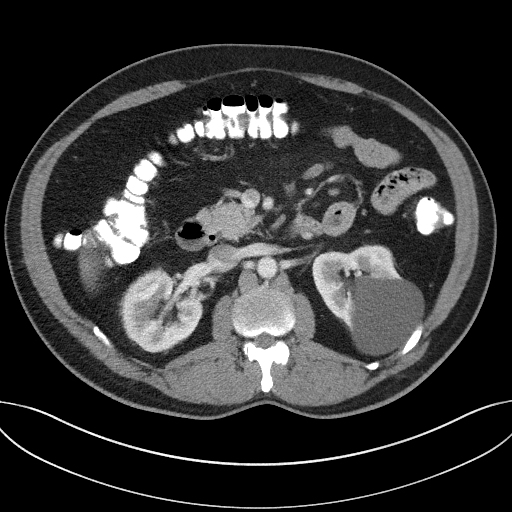
[im 69/107  bone]
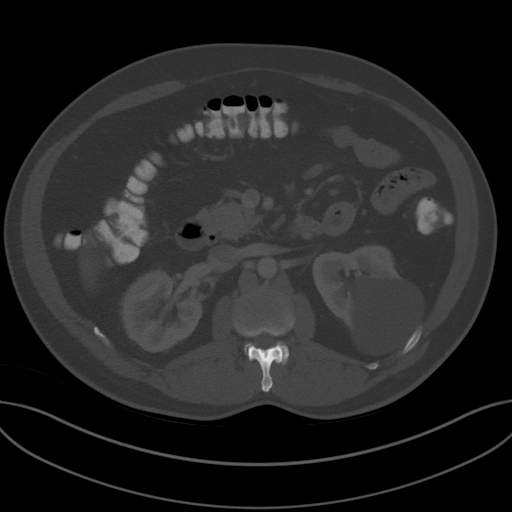
[im 75/107  soft-tissue]
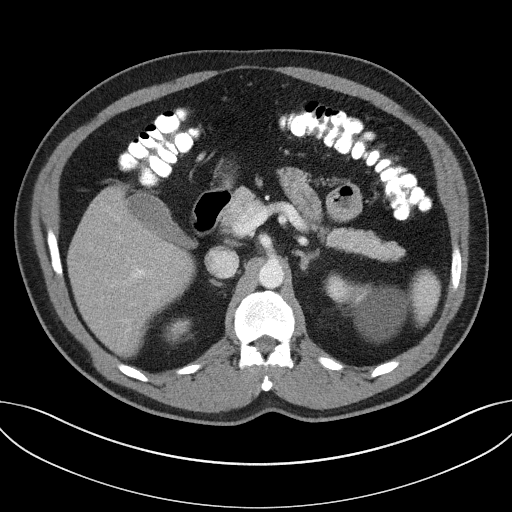
[im 85/107  soft-tissue]
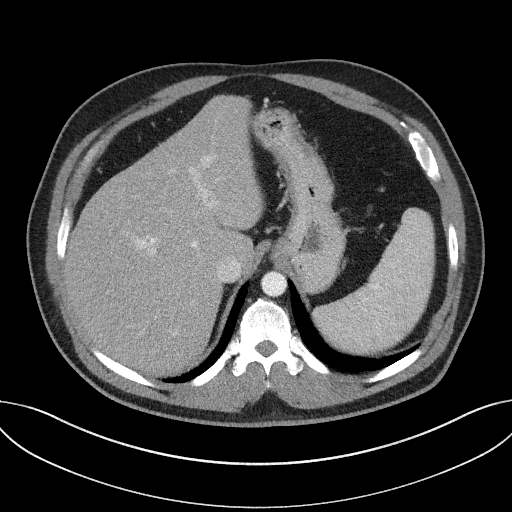
[im 91/107  soft-tissue]
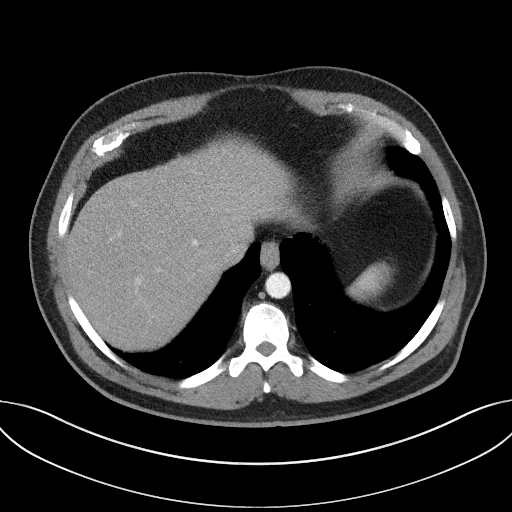
[im 101/107  soft-tissue]
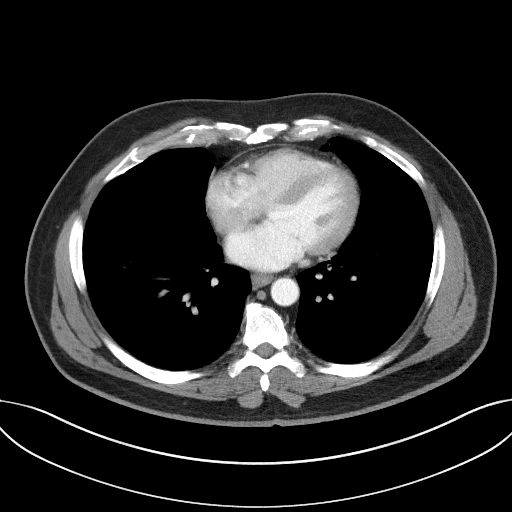

[Series 6: coronal st · coronal · 0.80mm/px · 3 of 104 slices shown]
[im 35/104  soft-tissue]
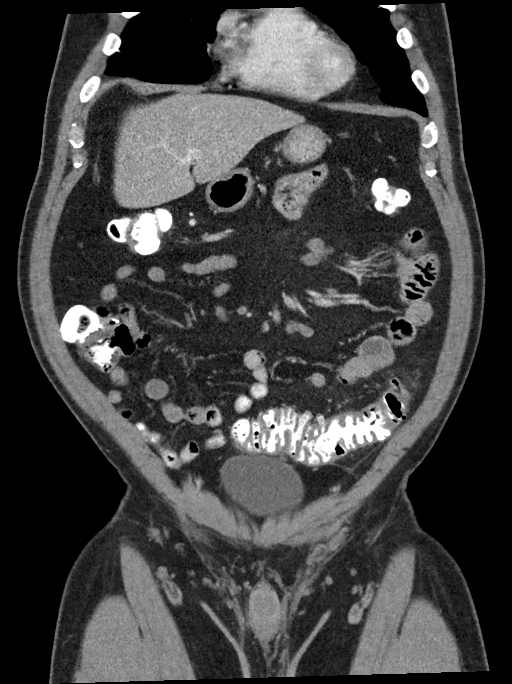
[im 46/104  soft-tissue]
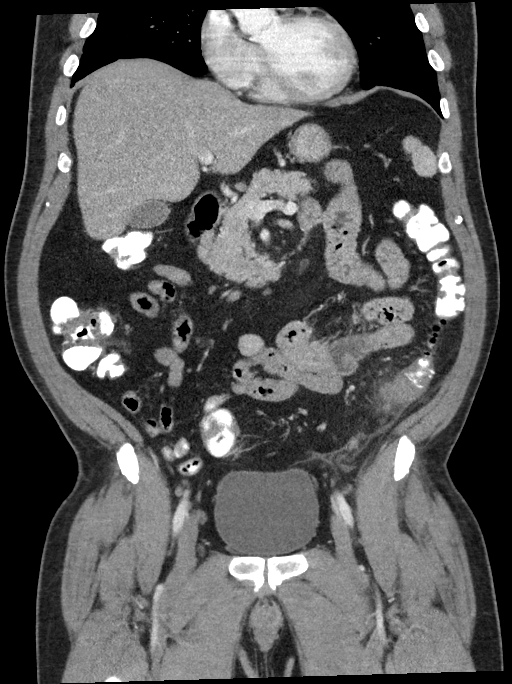
[im 58/104  soft-tissue]
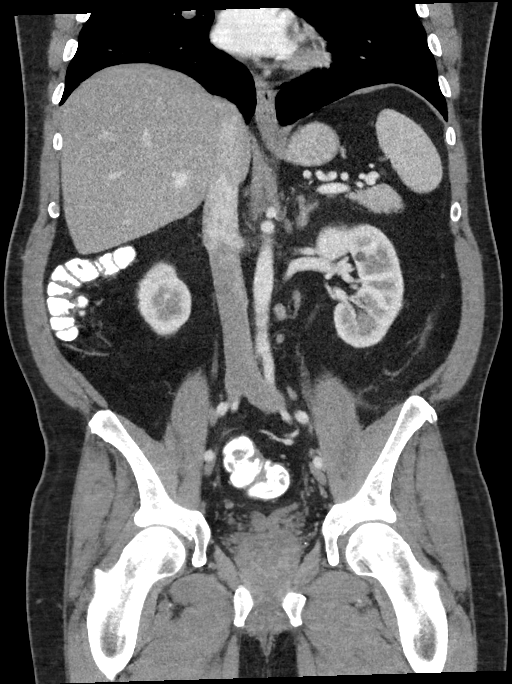

[16 of 46 positions shown; findings below may reference images not displayed]

RADIATION DOSE REDUCTION: This exam was performed according to the
departmental dose-optimization program which includes automated
exposure control, adjustment of the mA and/or kV according to
patient size and/or use of iterative reconstruction technique.

CONTRAST:  100mL OMNIPAQUE IOHEXOL 300 MG/ML  SOLN
FINDINGS: Lower chest: Mild linear scarring of the lateral right lower lung.
No pleural or pericardial fluid.

Hepatobiliary: Minimal fatty change of the liver. No focal lesion.
No calcified gallstones.

Pancreas: Normal

Spleen: Normal

Adrenals/Urinary Tract: Adrenal glands are normal. Right kidney is
normal. Left renal cyst previously seen. No obstruction or stone
disease. Bladder is normal.

Stomach/Bowel: Stomach and small intestine are normal. Normal
appendix. Colon appears normal to the mid descending colon. There is
extensive diverticulosis beyond that. There is acute diverticulitis
at the descending sigmoid junction with surrounding edema but no
evidence of abscess or free air.

Vascular/Lymphatic: The aorta and IVC are normal.  No adenopathy.

Reproductive: Normal

Other: No free fluid or air.

Musculoskeletal: Ordinary lower lumbar degenerative changes.
IMPRESSION: Acute diverticulitis at the descending/sigmoid colon junction.
Surrounding edema within the fat but no evidence of abscess or free
air.

## 2023-07-01 ENCOUNTER — Observation Stay
Admission: EM | Admit: 2023-07-01 | Discharge: 2023-07-03 | Disposition: A | Discharge status: Home / Self Care | Payer: Self-pay | Attending: Internal Medicine | Admitting: Internal Medicine

## 2023-07-01 ENCOUNTER — Observation Stay: Payer: Self-pay

## 2023-07-01 ENCOUNTER — Encounter: Payer: Self-pay | Admitting: Family Medicine

## 2023-07-01 ENCOUNTER — Other Ambulatory Visit: Payer: Self-pay

## 2023-07-01 ENCOUNTER — Emergency Department: Payer: Self-pay

## 2023-07-01 DIAGNOSIS — F32A Depression, unspecified: Secondary | ICD-10-CM | POA: Insufficient documentation

## 2023-07-01 DIAGNOSIS — F1010 Alcohol abuse, uncomplicated: Secondary | ICD-10-CM | POA: Diagnosis not present

## 2023-07-01 DIAGNOSIS — G459 Transient cerebral ischemic attack, unspecified: Principal | ICD-10-CM | POA: Diagnosis present

## 2023-07-01 DIAGNOSIS — I639 Cerebral infarction, unspecified: Secondary | ICD-10-CM | POA: Diagnosis present

## 2023-07-01 DIAGNOSIS — Z7982 Long term (current) use of aspirin: Secondary | ICD-10-CM | POA: Insufficient documentation

## 2023-07-01 DIAGNOSIS — J45909 Unspecified asthma, uncomplicated: Secondary | ICD-10-CM | POA: Diagnosis not present

## 2023-07-01 DIAGNOSIS — Z8709 Personal history of other diseases of the respiratory system: Secondary | ICD-10-CM | POA: Diagnosis not present

## 2023-07-01 DIAGNOSIS — I10 Essential (primary) hypertension: Secondary | ICD-10-CM | POA: Diagnosis not present

## 2023-07-01 LAB — CBC
HCT: 49.3 % (ref 39.0–52.0)
Hemoglobin: 17 g/dL (ref 13.0–17.0)
MCH: 29.8 pg (ref 26.0–34.0)
MCHC: 34.5 g/dL (ref 30.0–36.0)
MCV: 86.5 fL (ref 80.0–100.0)
Platelets: 281 10*3/uL (ref 150–400)
RBC: 5.7 MIL/uL (ref 4.22–5.81)
RDW: 12.5 % (ref 11.5–15.5)
WBC: 7.6 10*3/uL (ref 4.0–10.5)
nRBC: 0 % (ref 0.0–0.2)

## 2023-07-01 LAB — COMPREHENSIVE METABOLIC PANEL WITH GFR
ALT: 31 U/L (ref 0–44)
AST: 26 U/L (ref 15–41)
Albumin: 4.4 g/dL (ref 3.5–5.0)
Alkaline Phosphatase: 71 U/L (ref 38–126)
Anion gap: 11 (ref 5–15)
BUN: 9 mg/dL (ref 6–20)
CO2: 22 mmol/L (ref 22–32)
Calcium: 9.2 mg/dL (ref 8.9–10.3)
Chloride: 102 mmol/L (ref 98–111)
Creatinine, Ser: 0.78 mg/dL (ref 0.61–1.24)
GFR, Estimated: >60 mL/min (ref >60)
Glucose, Bld: 109 mg/dL — ABNORMAL HIGH (ref 70–99)
Potassium: 4 mmol/L (ref 3.5–5.1)
Sodium: 135 mmol/L (ref 135–145)
Total Bilirubin: 0.8 mg/dL (ref 0.0–1.2)
Total Protein: 7.2 g/dL (ref 6.5–8.1)

## 2023-07-01 LAB — ETHANOL: Alcohol, Ethyl (B): <15 mg/dL (ref <15)

## 2023-07-01 LAB — PROTIME-INR
INR: 0.9 (ref 0.8–1.2)
Prothrombin Time: 12.2 s (ref 11.4–15.2)

## 2023-07-01 LAB — DIFFERENTIAL
Abs Immature Granulocytes: 0.02 10*3/uL (ref 0.00–0.07)
Basophils Absolute: 0.1 10*3/uL (ref 0.0–0.1)
Basophils Relative: 1 %
Eosinophils Absolute: 0.3 10*3/uL (ref 0.0–0.5)
Eosinophils Relative: 4 %
Immature Granulocytes: 0 %
Lymphocytes Relative: 27 %
Lymphs Abs: 2.1 10*3/uL (ref 0.7–4.0)
Monocytes Absolute: 0.7 10*3/uL (ref 0.1–1.0)
Monocytes Relative: 9 %
Neutro Abs: 4.4 10*3/uL (ref 1.7–7.7)
Neutrophils Relative %: 59 %

## 2023-07-01 LAB — APTT: aPTT: 27 s (ref 24–36)

## 2023-07-01 LAB — HIV ANTIBODY (ROUTINE TESTING W REFLEX): HIV Screen 4th Generation wRfx: NONREACTIVE

## 2023-07-01 MED ORDER — ASPIRIN 81 MG PO TBEC
81.0000 mg | DELAYED_RELEASE_TABLET | Freq: Every day | ORAL | Status: DC
  Administered 2023-07-02 – 2023-07-03 (×2): 81 mg via ORAL
  Filled 2023-07-01 (×2): qty 1

## 2023-07-01 MED ORDER — LABETALOL HCL 5 MG/ML IV SOLN: 10.0000 mg | INTRAVENOUS | Status: DC | PRN

## 2023-07-01 MED ORDER — CLOPIDOGREL BISULFATE 75 MG PO TABS
75.0000 mg | ORAL_TABLET | Freq: Every day | ORAL | Status: DC
  Administered 2023-07-02 – 2023-07-03 (×2): 75 mg via ORAL
  Filled 2023-07-01 (×2): qty 1

## 2023-07-01 MED ORDER — ACETAMINOPHEN 160 MG/5ML PO SOLN: 650.0000 mg | ORAL | Status: DC | PRN

## 2023-07-01 MED ORDER — PAROXETINE HCL 10 MG PO TABS
10.0000 mg | ORAL_TABLET | Freq: Every day | ORAL | Status: DC
  Administered 2023-07-02 – 2023-07-03 (×2): 10 mg via ORAL
  Filled 2023-07-01 (×3): qty 1

## 2023-07-01 MED ORDER — ACETAMINOPHEN 500 MG PO TABS
1000.0000 mg | ORAL_TABLET | Freq: Once | ORAL | Status: DC
  Filled 2023-07-01: qty 2

## 2023-07-01 MED ORDER — ATORVASTATIN CALCIUM 20 MG PO TABS: 80.0000 mg | ORAL_TABLET | Freq: Every day | ORAL | Status: DC

## 2023-07-01 MED ORDER — SODIUM CHLORIDE 0.9% FLUSH: 3.0000 mL | Freq: Once | INTRAVENOUS | Status: DC

## 2023-07-01 MED ORDER — STROKE: EARLY STAGES OF RECOVERY BOOK
Freq: Once | Status: AC
  Administered 2023-07-02: 1

## 2023-07-01 MED ORDER — ENOXAPARIN SODIUM 60 MG/0.6ML IJ SOSY
50.0000 mg | PREFILLED_SYRINGE | INTRAMUSCULAR | Status: DC
  Administered 2023-07-01 – 2023-07-02 (×2): 50 mg via SUBCUTANEOUS
  Filled 2023-07-01 (×2): qty 0.6

## 2023-07-01 MED ORDER — ACETAMINOPHEN 325 MG PO TABS
650.0000 mg | ORAL_TABLET | ORAL | Status: DC | PRN
  Administered 2023-07-01 – 2023-07-02 (×2): 650 mg via ORAL
  Filled 2023-07-01 (×2): qty 2

## 2023-07-01 MED ORDER — SODIUM CHLORIDE 0.9 % IV SOLN: INTRAVENOUS | Status: AC

## 2023-07-01 MED ORDER — IOHEXOL 350 MG/ML SOLN
75.0000 mL | Freq: Once | INTRAVENOUS | Status: AC | PRN
  Administered 2023-07-01: 75 mL via INTRAVENOUS

## 2023-07-01 MED ORDER — ACETAMINOPHEN 650 MG RE SUPP: 650.0000 mg | RECTAL | Status: DC | PRN

## 2023-07-01 NOTE — Assessment & Plan Note (Signed)
 Mood fairly stable  Cont paxil 

## 2023-07-01 NOTE — Progress Notes (Signed)
 PHARMACIST - PHYSICIAN COMMUNICATION  CONCERNING:  Enoxaparin  (Lovenox ) for DVT Prophylaxis    RECOMMENDATION: Patient was prescribed enoxaprin 40mg  q24 hours for VTE prophylaxis.   Filed Weights   07/01/23 1208  Weight: 99.6 kg (219 lb 8 oz)    Body mass index is 33.37 kg/m.  Estimated Creatinine Clearance: 113.8 mL/min (by C-G formula based on SCr of 0.78 mg/dL).   Based on [facility] policy patient is candidate for enoxaparin  0.5mg /kg TBW SQ every 24 hours based on BMI being >30.  DESCRIPTION: Pharmacy has adjusted enoxaparin  dose per [facility] policy.  Patient is now receiving enoxaparin  50 mg every 24 hours    Ramonita Burow, PharmD Clinical Pharmacist  07/01/2023 12:12 PM

## 2023-07-01 NOTE — Progress Notes (Signed)
 OT Cancellation Note  Patient Details Name: MARQUON CIPRES MRN: [id number] DOB: 1965-04-14   Cancelled Treatment:    Reason Eval/Treat Not Completed: Patient at procedure or test/ unavailable. Pt off unit at CT during attempted OT eval on this date. Will re-attempt on next available time/date.  Rosaria Common M.S. OTR/L  07/01/23, 1:32 PM

## 2023-07-01 NOTE — ED Notes (Signed)
 PT at bedside.

## 2023-07-01 NOTE — Consult Note (Signed)
 CODE STROKE- PHARMACY COMMUNICATION  Time CODE STROKE called/page received: [id number]  Time response to CODE STROKE was made (in person or via phone): [phone]  Time Stroke Kit retrieved from [employer] (only if needed): TNK not indicated   Name of Provider/Nurse contacted: Dr. Doretta Gant   Past Medical History:  Diagnosis Date   Anginal pain (HCC)    Anxiety    Arthritis    Asthma    Family history of adverse reaction to anesthesia    mom had some nausea   Hypertension    Pneumonia    Sleep apnea    no CPAP   Stroke [facility])    2015   Prior to Admission medications   Medication Sig Start Date End Date Taking? Authorizing Provider  amLODipine  (NORVASC ) 10 MG tablet Take 10 mg by mouth daily. 02/09/14   [provider]  cetirizine (ZYRTEC) 10 MG tablet Take 10 mg by mouth daily.    [provider]  clopidogrel  (PLAVIX ) 75 MG tablet Take 1 tablet (75 mg total) by mouth daily. 01/25/14   Rinehuls, Helga Loan, PA-C  Coenzyme Q10 (COQ10 PO) Take 1 capsule by mouth daily.    [provider]  losartan -hydrochlorothiazide  (HYZAAR) 100-12.5 MG tablet Take 1 tablet by mouth daily.    [provider]  nitroGLYCERIN  (NITROSTAT ) 0.4 MG SL tablet Place 1 tablet (0.4 mg total) under the tongue every 5 (five) minutes as needed for chest pain. Patient not taking: Reported on 01/24/2022 08/06/15   Renold Cashing, MD  PARoxetine  (PAXIL ) 20 MG tablet Take 10 mg by mouth daily. 11/22/20   [provider]   Pansy Bogus, PharmD Pharmacy Resident  07/01/2023 10:14 AM

## 2023-07-01 NOTE — H&P (Signed)
 History and Physical    Patient: Fernando Goodwin:[id number] DOB: 10-May-1965 DOA: 07/01/2023 DOS: the patient was seen and examined on 07/01/2023 PCP: Antonio Baumgarten, MD  Patient coming from: Home  Chief Complaint:  Chief Complaint  Patient presents with   Code Stroke   HPI: Fernando Goodwin is a 58 y.o. male with medical history significant of hypertension, history of TIA, asthma, depression presenting with TIA.  Patient reports progressive onset of aphasia as well as mild left-sided weakness from earlier this morning.  Has remote history of TIA from back in 2015.  Has been otherwise asymptomatic since that point.  Has been on monotherapy with Plavix  since then.  Baseline history of high blood pressure as well.  Patient states that he has had some symptoms of generalized headache and fluid retention associated with systolic pressures into the 160s at home.  Does report that these episodes happen with the eating of pork products.  Does report recent medication changes including removal of hydrochlorothiazide  from BP regimen because of increased urination.  No orthopnea or PND.  No abdominal pain or diarrhea. Mild intermittent blurry vision.  Presented to the ER afebrile, hemodynamically stable.  Code stroke called. White count 7.6, hemoglobin 17, platelets 281, creatinine 0.79.  CT head within normal notes. Review of Systems: As mentioned in the history of present illness. All other systems reviewed and are negative. Past Medical History:  Diagnosis Date   Anginal pain (HCC)    Anxiety    Arthritis    Asthma    Family history of adverse reaction to anesthesia    mom had some nausea   Hypertension    Pneumonia    Sleep apnea    no CPAP   Stroke (HCC)    2015   Past Surgical History:  Procedure Laterality Date   FRACTURE SURGERY     fractured heel- right   HERNIA REPAIR     SHOULDER ARTHROSCOPY WITH SUBACROMIAL DECOMPRESSION, ROTATOR CUFF REPAIR AND BICEP TENDON REPAIR Right  01/25/2021   Procedure: SHOULDER ARTHROSCOPY WITH DEBRIDEMENT, DECOMPRESSION, BICEPS TENODESIS;  Surgeon: Elner Hahn, MD;  Location: [facility] ORS;  Service: Orthopedics;  Laterality: Right;   Social History:  reports that he has quit smoking. He has never used smokeless tobacco. He reports that he does not currently use alcohol. He reports that he does not use drugs.  Allergies  Allergen Reactions   Oxycodone  Nausea Only    Can tolerate with nausea medications    Family History  Problem Relation Age of Onset   Cancer Mother        Breast   Dementia Father    Hypertension Father     Prior to Admission medications   Medication Sig Start Date End Date Taking? Authorizing Provider  amLODipine  (NORVASC ) 10 MG tablet Take 10 mg by mouth daily. 02/09/14  Yes [provider]  cetirizine (ZYRTEC) 10 MG tablet Take 10 mg by mouth daily.   Yes [provider]  clopidogrel  (PLAVIX ) 75 MG tablet Take 1 tablet (75 mg total) by mouth daily. 01/25/14  Yes Rinehuls, Helga Loan, PA-C  Coenzyme Q10 (COQ10 PO) Take 1 capsule by mouth daily.   Yes [provider]  losartan  (COZAAR ) 100 MG tablet Take 100 mg by mouth daily. 06/07/23 06/06/24 Yes [provider]  nitroGLYCERIN  (NITROSTAT ) 0.4 MG SL tablet Place 1 tablet (0.4 mg total) under the tongue every 5 (five) minutes as needed for chest pain. 08/06/15  Yes Renold Cashing, MD  PARoxetine  (PAXIL ) 10 MG tablet Take 10 mg by mouth daily.   Yes [provider]  losartan -hydrochlorothiazide  (HYZAAR) 100-12.5 MG tablet Take 1 tablet by mouth daily. Patient not taking: Reported on 07/01/2023    [provider]  losartan -hydrochlorothiazide  (HYZAAR) 100-25 MG tablet Take 1 tablet by mouth daily. Patient not taking: Reported on 07/01/2023 01/10/23   [provider]  PARoxetine  (PAXIL ) 20 MG tablet Take 10 mg by mouth daily. Patient not taking: Reported on 07/01/2023 11/22/20   [provider]     Physical Exam: Vitals:   07/01/23 1006 07/01/23 1030 07/01/23 1130 07/01/23 1208  BP: 136/88 139/88 (!) 119/99   Pulse: 65 70 66   Resp: 17 (!) 21 (!) 21   Temp: 97.8 F (36.6 C)     TempSrc: Oral     SpO2: 100% 93% 94%   Weight:    99.6 kg  Height:    5\' 8"  (1.727 m)   Physical Exam Constitutional:      Appearance: He is normal weight.  HENT:     Head: Normocephalic and atraumatic.     Nose: Nose normal.     Mouth/Throat:     Mouth: Mucous membranes are moist.  Eyes:     Pupils: Pupils are equal, round, and reactive to light.  Cardiovascular:     Rate and Rhythm: Normal rate and regular rhythm.     Pulses: Normal pulses.  Pulmonary:     Effort: Pulmonary effort is normal.  Abdominal:     General: Bowel sounds are normal.  Musculoskeletal:        General: Normal range of motion.  Skin:    General: Skin is warm.  Neurological:     General: No focal deficit present.  Psychiatric:        Mood and Affect: Mood normal.     Data Reviewed:  There are no new results to review at this time.  CT HEAD CODE STROKE WO CONTRAST CLINICAL DATA:  Code stroke.  EXAM: CT HEAD WITHOUT CONTRAST  TECHNIQUE: Contiguous axial images were obtained from the base of the skull through the vertex without intravenous contrast.  RADIATION DOSE REDUCTION: This exam was performed according to the departmental dose-optimization program which includes automated exposure control, adjustment of the mA and/or kV according to patient size and/or use of iterative reconstruction technique.  COMPARISON:  04/17/2016  FINDINGS: Brain: No evidence of acute infarction, hemorrhage, hydrocephalus, extra-axial collection or mass lesion/mass effect.  Vascular: No hyperdense vessel or unexpected calcification.  Skull: Normal. Negative for fracture or focal lesion.  Sinuses/Orbits: No acute finding.  Other: Prelim sent in epic chat  ASPECTS [facility] Stroke Program Early CT Score)  -  Ganglionic level infarction (caudate, lentiform nuclei, internal capsule, insula, M1-M3 cortex): 7  - Supraganglionic infarction (M4-M6 cortex): 3  Total score (0-10 with 10 being normal): 10  IMPRESSION: 1. Negative head CT. 2. ASPECTS is 10.  Electronically Signed   By: Ronnette Coke M.D.   On: 07/01/2023 10:07  Lab Results  Component Value Date   WBC 7.6 07/01/2023   HGB 17.0 07/01/2023   HCT 49.3 07/01/2023   MCV 86.5 07/01/2023   PLT 281 07/01/2023   Last metabolic panel Lab Results  Component Value Date   GLUCOSE 109 (H) 07/01/2023   NA 135 07/01/2023   K 4.0 07/01/2023   CL 102 07/01/2023   CO2 22 07/01/2023   BUN 9 07/01/2023   CREATININE 0.78 07/01/2023   GFRNONAA >  60 07/01/2023   CALCIUM  9.2 07/01/2023   PROT 7.2 07/01/2023   ALBUMIN 4.4 07/01/2023   BILITOT 0.8 07/01/2023   ALKPHOS 71 07/01/2023   AST 26 07/01/2023   ALT 31 07/01/2023   ANIONGAP 11 07/01/2023    Assessment and Plan: TIA (transient ischemic attack) Noted transient episode of aphasia and left-sided weakness from earlier today with noted prior history of TIA in 2015 CT head within normal limits Stable neuroexam on evaluation at present Will plan for formal TIA evaluation including MRI of the brain, 2D echo, CTA of the head and neck, risk stratification Antiplatelet regimen per neurology recommendations High-dose statin Monitor   Depression Mood fairly stable  Cont paxil    ETOH abuse No reported active ETOH use    Essential hypertension Allow for permissive HTN in setting of CVA evaluation  Prn IV labetalol  for SBP>220 or DBP>110        Advance Care Planning:   Code Status: Full Code   Consults: Neurology  Family Communication: Wife at the bedside   Severity of Illness: The appropriate patient status for this patient is OBSERVATION. Observation status is judged to be reasonable and necessary in order to provide the required intensity of service to ensure the  patient's safety. The patient's presenting symptoms, physical exam findings, and initial radiographic and laboratory data in the context of their medical condition is felt to place them at decreased risk for further clinical deterioration. Furthermore, it is anticipated that the patient will be medically stable for discharge from the hospital within 2 midnights of admission.   Author: Corrinne Din, MD 07/01/2023 12:22 PM  For on call review [email or web address].

## 2023-07-01 NOTE — Consult Note (Signed)
 NEUROLOGY CONSULT NOTE   Date of service: Jul 01, 2023 Patient Name: Fernando Goodwin MRN:  [id number] DOB:  12/24/1965 Chief Complaint: staring spell vs transient global aphasia Requesting Provider: Corrinne Din, MD  History of Present Illness   This is a 58 year old gentleman with past medical history significant for hypertension, sleep apnea not on CPAP, anxiety who was brought in by EMS as a stroke code for difficulty speaking and left-sided weakness.  When he arrived to the emergency department his symptoms had resolved and his stroke scale was 0.  Symptoms began at approximately 9 AM and was initially described by EMS as unable to speak however wife describes the episode as more of a staring spell.  He actually remembers everything that was going on at the time and the words said around him but was unable to respond in the moment.  EMS did not identify any other deficits but patient says he had mild left-sided weakness just prior to the spell this morning.  CT head showed no acute process.  TNK was not administered due to resolution of symptoms.  Chart includes diagnosis of stroke in 2015 however I reviewed our MRI imaging from that admission and although he presented with similar symptoms as today MRI was found to be negative and symptoms were attributed to TIA.  LKW: currently asymptomatic Modified rankin score: 0-Completely asymptomatic and back to baseline post- stroke   NIHSS components Score: Comment  1a Level of Conscious 0[]  1[]  2[]  3[]      1b LOC Questions 0[]  1[]  2[]       1c LOC Commands 0[]  1[]  2[]       2 Best Gaze 0[]  1[]  2[]       3 Visual 0[]  1[]  2[]  3[]      4 Facial Palsy 0[]  1[]  2[]  3[]      5a Motor Arm - left 0[]  1[]  2[]  3[]  4[]  UN[]    5b Motor Arm - Right 0[]  1[]  2[]  3[]  4[]  UN[]    6a Motor Leg - Left 0[]  1[]  2[]  3[]  4[]  UN[]    6b Motor Leg - Right 0[]  1[]  2[]  3[]  4[]  UN[]    7 Limb Ataxia 0[]  1[]  2[]  UN[]      8 Sensory 0[]  1[]  2[]  UN[]      9 Best Language 0[]   1[]  2[]  3[]      10 Dysarthria 0[]  1[]  2[]  UN[]      11 Extinct. and Inattention 0[]  1[]  2[]       TOTAL:       ROS  ***Comprehensive ROS performed and pertinent positives documented in HPI  ***Unable to ascertain due to ***  Past History   Past Medical History:  Diagnosis Date   Anginal pain (HCC)    Anxiety    Arthritis    Asthma    Family history of adverse reaction to anesthesia    mom had some nausea   Hypertension    Pneumonia    Sleep apnea    no CPAP   Stroke (HCC)    2015    Past Surgical History:  Procedure Laterality Date   FRACTURE SURGERY     fractured heel- right   HERNIA REPAIR     SHOULDER ARTHROSCOPY WITH SUBACROMIAL DECOMPRESSION, ROTATOR CUFF REPAIR AND BICEP TENDON REPAIR Right 01/25/2021   Procedure: SHOULDER ARTHROSCOPY WITH DEBRIDEMENT, DECOMPRESSION, BICEPS TENODESIS;  Surgeon: Elner Hahn, MD;  Location: [facility] ORS;  Service: Orthopedics;  Laterality: Right;    Family History: Family History  Problem Relation Age of Onset  Cancer Mother        Breast   Dementia Father    Hypertension Father     Social History  reports that he has quit smoking. He has never used smokeless tobacco. He reports that he does not currently use alcohol. He reports that he does not use drugs.  Allergies  Allergen Reactions   Oxycodone  Nausea Only    Can tolerate with nausea medications    Medications   Current Facility-Administered Medications:    [START ON 07/02/2023]  stroke: early stages of recovery book, , Does not apply, Once, Corrinne Din, MD   0.9 %  sodium chloride  infusion, , Intravenous, Continuous, Corrinne Din, MD, Last Rate: 40 mL/hr at 07/01/23 1406, New Bag at 07/01/23 1406   acetaminophen  (TYLENOL ) tablet 650 mg, 650 mg, Oral, Q4H PRN, 650 mg at 07/01/23 1819 **OR** acetaminophen  (TYLENOL ) 160 MG/5ML solution 650 mg, 650 mg, Per Tube, Q4H PRN **OR** acetaminophen  (TYLENOL ) suppository 650 mg, 650 mg, Rectal, Q4H PRN, Corrinne Din,  MD   acetaminophen  (TYLENOL ) tablet 1,000 mg, 1,000 mg, Oral, Once, Ruth Cove, MD   aspirin  EC tablet 81 mg, 81 mg, Oral, Daily, Corrinne Din, MD   atorvastatin  (LIPITOR) tablet 80 mg, 80 mg, Oral, Daily, Corrinne Din, MD   [START ON 07/02/2023] clopidogrel  (PLAVIX ) tablet 75 mg, 75 mg, Oral, Daily, Corrinne Din, MD   enoxaparin  (LOVENOX ) injection 50 mg, 50 mg, Subcutaneous, Q24H, Corrinne Din, MD, 50 mg at 07/01/23 2154   labetalol  (NORMODYNE ) injection 10 mg, 10 mg, Intravenous, Q2H PRN, Corrinne Din, MD   PARoxetine  (PAXIL ) tablet 10 mg, 10 mg, Oral, Daily, Corrinne Din, MD   sodium chloride  flush (NS) 0.9 % injection 3 mL, 3 mL, Intravenous, Once, Ruth Cove, MD  Current Outpatient Medications:    amLODipine  (NORVASC ) 10 MG tablet, Take 10 mg by mouth daily., Disp: , Rfl:    cetirizine (ZYRTEC) 10 MG tablet, Take 10 mg by mouth daily., Disp: , Rfl:    clopidogrel  (PLAVIX ) 75 MG tablet, Take 1 tablet (75 mg total) by mouth daily., Disp: 30 tablet, Rfl: 3   Coenzyme Q10 (COQ10 PO), Take 1 capsule by mouth daily., Disp: , Rfl:    losartan  (COZAAR ) 100 MG tablet, Take 100 mg by mouth daily., Disp: , Rfl:    nitroGLYCERIN  (NITROSTAT ) 0.4 MG SL tablet, Place 1 tablet (0.4 mg total) under the tongue every 5 (five) minutes as needed for chest pain., Disp: 30 tablet, Rfl: 2   PARoxetine  (PAXIL ) 10 MG tablet, Take 10 mg by mouth daily., Disp: , Rfl:    losartan -hydrochlorothiazide  (HYZAAR) 100-12.5 MG tablet, Take 1 tablet by mouth daily. (Patient not taking: Reported on 07/01/2023), Disp: , Rfl:    losartan -hydrochlorothiazide  (HYZAAR) 100-25 MG tablet, Take 1 tablet by mouth daily. (Patient not taking: Reported on 07/01/2023), Disp: , Rfl:    PARoxetine  (PAXIL ) 20 MG tablet, Take 10 mg by mouth daily. (Patient not taking: Reported on 07/01/2023), Disp: , Rfl:   Vitals   Vitals:   07/01/23 1817 07/01/23 1909 07/01/23 2000 07/01/23 2200  BP:  120/81 120/67  131/80  Pulse:  75 69 69  Resp:  (!) 22 17 19   Temp: 98.4 F (36.9 C)   97.7 F (36.5 C)  TempSrc: Oral   Oral  SpO2:  97% 96% 96%  Weight:      Height:        Body mass index is 33.37 kg/m.  Physical Exam    Gen: patient lying in bed, NAD CV: extremities appear well-perfused Resp: normal WOB  Neurologic Examination   MS: alert, oriented x4, follows commands Speech: no dysarthria, no aphasia CN: PERRL, VFF, EOMI, sensation intact, face symmetric, hearing intact to voice Motor: 5/5 strength throughout Sensory: SILT Reflexes: 2+ symm with toes down bilat Coordination: FNF intact bilat Gait: deferred  Labs/Imaging/Neurodiagnostic studies   CBC:  Recent Labs  Lab 31-Jul-2023 1003  WBC 7.6  NEUTROABS 4.4  HGB 17.0  HCT 49.3  MCV 86.5  PLT 281   Basic Metabolic Panel:  Lab Results  Component Value Date   NA 135 Jul 31, 2023   K 4.0 July 31, 2023   CO2 22 07/31/23   GLUCOSE 109 (H) 07-31-2023   BUN 9 31-Jul-2023   CREATININE 0.78 07/31/23   CALCIUM  9.2 07/31/2023   GFRNONAA >60 July 31, 2023   GFRAA >60 08/15/2016   Lipid Panel:  Lab Results  Component Value Date   LDLCALC 67 08/16/2016   HgbA1c:  Lab Results  Component Value Date   HGBA1C 5.2 08/16/2016   Urine Drug Screen:     Component Value Date/Time   LABOPIA NONE DETECTED 01/22/2014 1543   COCAINSCRNUR NONE DETECTED 01/22/2014 1543   LABBENZ NONE DETECTED 01/22/2014 1543   AMPHETMU NONE DETECTED 01/22/2014 1543   THCU NONE DETECTED 01/22/2014 1543   LABBARB NONE DETECTED 01/22/2014 1543    Alcohol Level     Component Value Date/Time   ETH <15 31-Jul-2023 1003   INR  Lab Results  Component Value Date   INR 0.9 2023/07/31   APTT  Lab Results  Component Value Date   APTT 27 07/31/23   AED levels: No results found for: "PHENYTOIN", "ZONISAMIDE", "LAMOTRIGINE", "LEVETIRACETA"  CT Head without contrast(Personally reviewed): No acute process  ASSESSMENT   HILDING MALLARY is a 58  y.o. male ***  RECOMMENDATIONS  *** ______________________________________________________________________    Signed, Eleni Griffin, MD [facility]

## 2023-07-01 NOTE — Evaluation (Signed)
 Physical Therapy Evaluation Patient Details Name: Fernando Goodwin MRN: [id number] DOB: 1965-11-17 Today's Date: 07/01/2023  History of Present Illness  Fernando Goodwin is a 58 y.o. male with medical history significant of hypertension, history of TIA, asthma, depression presenting with TIA.  Patient reports progressive onset of aphasia as well as mild left-sided weakness from earlier this morning.  Has remote history of TIA from back in 2015. MRI negative at this time.  Clinical Impression  Patient received in ED on stretcher, wife at bedside. He is independent with bed mobility, transfers and ambulation in room at this time. No skilled PT needs. Will sign off.         If plan is discharge home, recommend the following:  N/a   Can travel by private vehicle    yes    Equipment Recommendations None recommended by PT  Recommendations for Other Services       Functional Status Assessment Patient has not had a recent decline in their functional status     Precautions / Restrictions Precautions Precautions: None Restrictions Weight Bearing Restrictions Per Provider Order: No      Mobility  Bed Mobility Overal bed mobility: Independent                  Transfers Overall transfer level: Independent                      Ambulation/Gait Ambulation/Gait assistance: Independent Gait Distance (Feet): 50 Feet Assistive device: None Gait Pattern/deviations: Step-through pattern Gait velocity: decr     General Gait Details: no difficulties ambulating in room  Stairs            Wheelchair Mobility     Tilt Bed    Modified Rankin (Stroke Patients Only)       Balance Overall balance assessment: Independent                                           Pertinent Vitals/Pain Pain Assessment Pain Assessment: Faces Faces Pain Scale: Hurts a little bit Pain Descriptors / Indicators: Headache    Home Living Family/patient expects to  be discharged to:: Private residence Living Arrangements: Spouse/significant other Available Help at Discharge: Family;Available PRN/intermittently Type of Home: House Home Access: Stairs to enter Entrance Stairs-Rails: None Entrance Stairs-Number of Steps: 4   Home Layout: One level Home Equipment: None      Prior Function Prior Level of Function : Independent/Modified Independent                     Extremity/Trunk Assessment   Upper Extremity Assessment Upper Extremity Assessment: Overall WFL for tasks assessed    Lower Extremity Assessment Lower Extremity Assessment: Overall WFL for tasks assessed    Cervical / Trunk Assessment Cervical / Trunk Assessment: Normal  Communication   Communication Communication: No apparent difficulties    Cognition Arousal: Alert Behavior During Therapy: WFL for tasks assessed/performed   PT - Cognitive impairments: No apparent impairments                         Following commands: Intact       Cueing Cueing Techniques: Verbal cues     General Comments      Exercises     Assessment/Plan    PT Assessment Patient does not need any  further PT services  PT Problem List         PT Treatment Interventions      PT Goals (Current goals can be found in the Care Plan section)  Acute Rehab PT Goals Patient Stated Goal: return home PT Goal Formulation: With patient/family Time For Goal Achievement: 07/04/23 Potential to Achieve Goals: Good    Frequency       Co-evaluation               AM-PAC PT "6 Clicks" Mobility  Outcome Measure Help needed turning from your back to your side while in a flat bed without using bedrails?: None Help needed moving from lying on your back to sitting on the side of a flat bed without using bedrails?: None Help needed moving to and from a bed to a chair (including a wheelchair)?: None Help needed standing up from a chair using your arms (e.g., wheelchair or  bedside chair)?: None Help needed to walk in hospital room?: None Help needed climbing 3-5 steps with a railing? : None 6 Click Score: 24    End of Session   Activity Tolerance: Patient tolerated treatment well Patient left: Other (comment) (seated on side of bed) Nurse Communication: Mobility status      Time: 1610-9604 PT Time Calculation (min) (ACUTE ONLY): 9 min   Charges:   PT Evaluation $PT Eval Low Complexity: 1 Low   PT General Charges $$ ACUTE PT VISIT: 1 Visit         Emberli Ballester, PT, GCS 07/01/23,1:59 PM

## 2023-07-01 NOTE — Progress Notes (Signed)
 Occupational Therapy Evaluation Patient Details Name: Fernando Goodwin MRN: [id number] DOB: 10-27-1965 Today's Date: 07/01/2023   History of Present Illness   Fernando Goodwin is a 58 y.o. male with medical history significant of hypertension, history of TIA, asthma, depression presenting with TIA.  Patient reports progressive onset of aphasia as well as mild left-sided weakness from earlier this morning.  Has remote history of TIA from back in 2015. MRI negative at this time.     Clinical Impressions Pt seen for OT evaluation this date. Prior to hospital admission, pt was independent in all aspects of ADL/IADL, amb with no DME, working and driving. Pt denies falls history in past 12 months. Pt lives with his spouse in a one level home with 4 steps to enter. Currently pt reporting symptoms have resolved, only noted that he feels more sensitive to lights on this date more than his baseline, also reports he has a lingering headache. Pt amb within room indep with supervision for safety, no DME use or LOB noted. Pt don/doffed bilateral shoes indep while seated on the EOB. VSS.  Pt demonstrates baseline independence to perform ADL and mobility tasks and no strength, sensory, coordination, cognitive, or visual deficits appreciated with assessment. No skilled OT needs identified. Will sign off. Please re-consult if additional OT needs arise.      If plan is discharge home, recommend the following:         Functional Status Assessment   Patient has not had a recent decline in their functional status     Equipment Recommendations   None recommended by OT     Recommendations for Other Services         Precautions/Restrictions   Precautions Precautions: None Restrictions Weight Bearing Restrictions Per Provider Order: No     Mobility Bed Mobility Overal bed mobility: Independent                  Transfers Overall transfer level: Independent                  General transfer comment: STS from multiple surfaces with no difficulties      Balance Overall balance assessment: Independent                                         ADL either performed or assessed with clinical judgement   ADL Overall ADL's : Independent;At baseline                                       General ADL Comments: Completed LB dressing and toilet transfer during session indep.     Vision Patient Visual Report: Other (comment) (Pt reports slightly more sensitive on this date to normal room lighting) Additional Comments: No other changes from baseline     Perception Perception: Within Functional Limits       Praxis Praxis: WFL       Pertinent Vitals/Pain Pain Assessment Faces Pain Scale: Hurts a little bit Pain Descriptors / Indicators: Headache     Extremity/Trunk Assessment Upper Extremity Assessment Upper Extremity Assessment: Overall WFL for tasks assessed   Lower Extremity Assessment Lower Extremity Assessment: Overall WFL for tasks assessed;Defer to PT evaluation   Cervical / Trunk Assessment Cervical / Trunk Assessment: Normal   Communication Communication Communication: No  apparent difficulties   Cognition Arousal: Alert Behavior During Therapy: WFL for tasks assessed/performed               OT - Cognition Comments: A/Ox4                 Following commands: Intact       Cueing  General Comments   Cueing Techniques: Verbal cues      Exercises Exercises: Other exercises Other Exercises Other Exercises: Edu: Role of OT in acute care   Shoulder Instructions      Home Living Family/patient expects to be discharged to:: Private residence Living Arrangements: Spouse/significant other Available Help at Discharge: Family;Available PRN/intermittently Type of Home: House Home Access: Stairs to enter [employer] of Steps: 4 Entrance Stairs-Rails: None Home Layout: One  level     Bathroom Shower/Tub: Chief Strategy Officer: Standard     Home Equipment: None          Prior Functioning/Environment Prior Level of Function : Independent/Modified Independent;Driving;Working/employed                      OT Goals(Current goals can be found in the care plan section)   Acute Rehab OT Goals Patient Stated Goal: Go home today OT Goal Formulation: With patient Time For Goal Achievement: 07/15/23 Potential to Achieve Goals: Good   OT Frequency:       Co-evaluation              AM-PAC OT "6 Clicks" Daily Activity     Outcome Measure Help from another person eating meals?: None Help from another person taking care of personal grooming?: None Help from another person toileting, which includes using toliet, bedpan, or urinal?: None Help from another person bathing (including washing, rinsing, drying)?: None Help from another person to put on and taking off regular upper body clothing?: None Help from another person to put on and taking off regular lower body clothing?: None 6 Click Score: 24   End of Session Equipment Utilized During Treatment: Gait belt Nurse Communication: Mobility status  Activity Tolerance: Patient tolerated treatment well Patient left: in bed;with call bell/phone within reach;with family/visitor present                   Time: 0981-1914 OT Time Calculation (min): 10 min Charges:  OT General Charges $OT Visit: 1 Visit OT Evaluation $OT Eval Low Complexity: 1 Low  Rosaria Common M.S. OTR/L  07/01/23, 2:52 PM

## 2023-07-01 NOTE — Consult Note (Signed)
 Code stroke activated at 0945 by EMS pre elert. Dr Doretta Gant paged by Tilford Foley at [phone] and was in the ED at [phone].  Dr Doretta Gant examined pt on arrival at 9. NIH 0. Pt left for CT at 0953.  Spoke with Dr Doretta Gant over camera in CT at 0957-outside window for TNK and resolved symptoms, telestroke dismissed at this time.   Ruven Coy, telestroke RN

## 2023-07-01 NOTE — ED Notes (Signed)
 Pt to CT

## 2023-07-01 NOTE — ED Triage Notes (Signed)
 Pt arrives via [facility] from home for aphasia and L side weakness and pain. Upon arrival to ER, pt NIHSS 0 and able to verbalize all sx to staff.

## 2023-07-01 NOTE — Assessment & Plan Note (Signed)
 Noted transient episode of aphasia and left-sided weakness from earlier today with noted prior history of TIA in 2015 CT head within normal limits Stable neuroexam on evaluation at present Will plan for formal TIA evaluation including MRI of the brain, 2D echo, CTA of the head and neck, risk stratification Antiplatelet regimen per neurology recommendations High-dose statin Monitor

## 2023-07-01 NOTE — Assessment & Plan Note (Signed)
 Allow for permissive HTN in setting of CVA evaluation  Prn IV labetalol for SBP>220 or DBP>110

## 2023-07-01 NOTE — ED Provider Notes (Signed)
 [facility] Provider Note    Event Date/Time   First MD Initiated Contact with Patient 07/01/23 [phone]     (approximate)  History   Chief Complaint: Code Stroke  HPI  Fernando Goodwin is a 58 y.o. male with a past medical history of a prior CVA 10 years ago, TIA, hypertension, anxiety, presents to the emergency department for strokelike symptoms as a code stroke.  According to EMS they were called out to the patient's residence for inability to speak this morning.  Wife states this has happened before due to TIAs but usually resolves within 1 or 2 minutes.  Today lasted for over 20 minutes so she called EMS.  EMS states upon their arrival patient was beginning to speak again, code stroke was called and the patient was brought emergently to the emergency department.  Here patient was able to answer questions appropriately, he states he feels like most of his symptoms have resolved at this time but continues to have some pressure sensation to his head and feeling somewhat foggy.  Patient seen in conjunction with neurology upon arrival.  Physical Exam   Triage Vital Signs: ED Triage Vitals  Encounter Vitals Group     BP      Systolic BP Percentile      Diastolic BP Percentile      Pulse      Resp      Temp      Temp src      SpO2      Weight      Height      Head Circumference      Peak Flow      Pain Score      Pain Loc      Pain Education      Exclude from Growth Chart     Most recent vital signs: There were no vitals filed for this visit.  General: Awake, no distress.  CV:  Good peripheral perfusion.  Regular rate and rhythm  Resp:  Normal effort.  Equal breath sounds bilaterally.  Abd:  No distention.  Soft, nontender.  No rebound or guarding. Other:  Equal grip strength bilaterally.  No pronator drift.  No lower extremity drift.   ED Results / Procedures / Treatments   RADIOLOGY  I have reviewed and interpreted CT images of the head.  No  bleed seen on my evaluation. Radiology has read the CT scan as negative.   MEDICATIONS ORDERED IN ED: Medications  sodium chloride  flush (NS) 0.9 % injection 3 mL (has no administration in time range)     IMPRESSION / MDM / ASSESSMENT AND PLAN / ED COURSE  I reviewed the triage vital signs and the nursing notes.  Patient's presentation is most consistent with acute presentation with potential threat to life or bodily function.  Patient presents to the emergency department for difficulty speaking starting this morning.  Last known normal was last night before going to bed around 10 PM.  Patient was made a code stroke by EMS, neurology has seen in conjunction with myself upon arrival.  CT scan of the head does not appear to show any acute finding.  Patient's lab work is reassuring occluding a normal CBC chemistry negative ethanol.  Patient's NIH stroke scale is 0 at the moment with improved symptoms, not a candidate for TNK. I spoke to neurology who is seen and evaluated the patient as well, they would like the patient admitted for a stroke  workup and continued monitoring.  Patient agreeable to plan of care.  Does state mild headache we will dose Tylenol  while awaiting admission.   CRITICAL CARE Performed by: Ruth Cove   Total critical care time: 30 minutes  Critical care time was exclusive of separately billable procedures and treating other patients.  Critical care was necessary to treat or prevent imminent or life-threatening deterioration.  Critical care was time spent personally by me on the following activities: development of treatment plan with patient and/or surrogate as well as nursing, discussions with consultants, evaluation of patient's response to treatment, examination of patient, obtaining history from patient or surrogate, ordering and performing treatments and interventions, ordering and review of laboratory studies, ordering and review of radiographic studies,  pulse oximetry and re-evaluation of patient's condition.   FINAL CLINICAL IMPRESSION(S) / ED DIAGNOSES   Transient ischemic attack  Note:  This document was prepared using Dragon voice recognition software and may include unintentional dictation errors.   Ruth Cove, MD 07/01/23 1059

## 2023-07-01 NOTE — ED Notes (Signed)
 Pt to MRI

## 2023-07-01 NOTE — Assessment & Plan Note (Signed)
 No reported active ETOH use

## 2023-07-02 ENCOUNTER — Observation Stay

## 2023-07-02 DIAGNOSIS — G459 Transient cerebral ischemic attack, unspecified: Secondary | ICD-10-CM | POA: Diagnosis not present

## 2023-07-02 DIAGNOSIS — R569 Unspecified convulsions: Secondary | ICD-10-CM | POA: Diagnosis not present

## 2023-07-02 LAB — HEMOGLOBIN A1C
Hgb A1c MFr Bld: 5.6 % (ref 4.8–5.6)
Mean Plasma Glucose: 114 mg/dL

## 2023-07-02 LAB — LIPID PANEL
Cholesterol: 201 mg/dL — ABNORMAL HIGH (ref 0–200)
HDL: 42 mg/dL (ref >40)
LDL Cholesterol: 86 mg/dL (ref 0–99)
Total CHOL/HDL Ratio: 4.8 ratio
Triglycerides: 367 mg/dL — ABNORMAL HIGH (ref <150)
VLDL: 73 mg/dL — ABNORMAL HIGH (ref 0–40)

## 2023-07-02 LAB — GLUCOSE, CAPILLARY: Glucose-Capillary: 110 mg/dL — ABNORMAL HIGH (ref 70–99)

## 2023-07-02 MED ORDER — LOSARTAN POTASSIUM 50 MG PO TABS
100.0000 mg | ORAL_TABLET | Freq: Every day | ORAL | Status: DC
  Administered 2023-07-02 – 2023-07-03 (×2): 100 mg via ORAL
  Filled 2023-07-02 (×2): qty 2

## 2023-07-02 MED ORDER — AMLODIPINE BESYLATE 10 MG PO TABS
10.0000 mg | ORAL_TABLET | Freq: Every day | ORAL | Status: DC
  Administered 2023-07-02 – 2023-07-03 (×2): 10 mg via ORAL
  Filled 2023-07-02 (×2): qty 1

## 2023-07-02 MED ORDER — SIMVASTATIN 20 MG PO TABS
20.0000 mg | ORAL_TABLET | Freq: Every day | ORAL | Status: DC
  Administered 2023-07-02: 20 mg via ORAL
  Filled 2023-07-02: qty 1

## 2023-07-02 NOTE — Progress Notes (Addendum)
 Progress Note    Fernando Goodwin  ZOX:[id number] DOB: [birth year]  DOA: 07/01/2023 PCP: Antonio Baumgarten, MD      Brief Narrative:    Medical records reviewed and are as summarized below:  Fernando Goodwin is a 58 y.o. male with medical history significant for hypertension, depression, asthma, history of TIA, who presented to the hospital because of aphasia and left-sided weakness.  His wife described it as a staring spell during which patient was unable to speak.  He said symptoms lasted only about 20 minutes and by the time he arrived in the ED, symptoms had resolved.   MRI brain did not show any evidence of stroke and CTA head and neck did not show any large vessel occlusion.  He was admitted to the hospital for TIA.    Assessment/Plan:   Principal Problem:   TIA (transient ischemic attack) Active Problems:   Essential hypertension   ETOH abuse   Depression    Body mass index is 31.84 kg/m.  (Class I obesity)   TIA: MRI brain did not show any evidence of acute stroke.  CTA head and neck did not show any large vessel occlusion. She has been seen by Dr. Sandrea Cruel, neurologist.  Dual antiplatelet therapy with aspirin  and Plavix  for 21 days followed by Plavix  monotherapy. He did well with PT and OT. EEG was unremarkable. 2D echo is pending   Dyslipidemia: Total cholesterol 201, triglycerides 367, HDL 42, LDL 86. Patient will also be started on simvastatin.  Of note, he said he had tried rosuvastatin and atorvastatin  in the past but he discontinued them because of myalgia.  He is willing to try simvastatin. Hemoglobin A1c is pending   Hypertension: Continue amlodipine  and losartan    Depression: Continue Paxil    Diet Order             Diet heart healthy/carb modified Fluid consistency: Thin  Diet effective now                            Consultants: Neurologist  Procedures: None    Medications:     stroke: early stages of recovery book    Does not apply Once   acetaminophen   1,000 mg Oral Once   amLODipine   10 mg Oral Daily   aspirin  EC  81 mg Oral Daily   clopidogrel   75 mg Oral Daily   enoxaparin  (LOVENOX ) injection  50 mg Subcutaneous Q24H   losartan   100 mg Oral Daily   PARoxetine   10 mg Oral Daily   simvastatin  20 mg Oral q1800   sodium chloride  flush  3 mL Intravenous Once   Continuous Infusions:   Anti-infectives (From admission, onward)    None              Family Communication/Anticipated D/C date and plan/Code Status   DVT prophylaxis:      Code Status: Full Code  Family Communication: None Disposition Plan: Plan to discharge home   Status is: Observation The patient remains OBS appropriate and will d/c before 2 midnights. Workup for TIA still in progress      Subjective:   Interval events noted.  He has a mild headache otherwise he feels better.  His speech impediment is back to normal.  No unilateral tingling, numbness or weakness in the extremities.  Objective:    Vitals:   07/01/23 2328 07/01/23 2353 07/02/23 0448 07/02/23 0735  BP: 117/65 129/86 [name])  142/84 (!) 136/91  Pulse: 70 65 77 65  Resp: 19 16 16    Temp:  97.8 F (36.6 C) 97.8 F (36.6 C) 98 F (36.7 C)  TempSrc:  Oral  Oral  SpO2: 97% 96% 93% 94%  Weight:  95 kg    Height:  5\' 8"  (1.727 m)     No data found.   Intake/Output Summary (Last 24 hours) at 07/02/2023 1547 Last data filed at 07/02/2023 1520 Gross per 24 hour  Intake 1081.1 ml  Output --  Net 1081.1 ml   Filed Weights   07/01/23 1208 07/01/23 2353  Weight: 99.6 kg 95 kg    Exam:  GEN: NAD SKIN: Warm and dry EYES: PERRLA , EOMI, no pallor or icterus, no visual field cut ENT: MMM CV: RRR PULM: CTA B ABD: soft, ND, NT, +BS CNS: AAO x 3, non focal EXT: No edema or tenderness        Data Reviewed:   I have personally reviewed following labs and imaging studies:  Labs: Labs show the following:   Basic Metabolic  Panel: Recent Labs  Lab 07/01/23 1003  NA 135  K 4.0  CL 102  CO2 22  GLUCOSE 109*  BUN 9  CREATININE 0.78  CALCIUM  9.2   GFR Estimated Creatinine Clearance: 111.1 mL/min (by C-G formula based on SCr of 0.78 mg/dL). Liver Function Tests: Recent Labs  Lab 07/01/23 1003  AST 26  ALT 31  ALKPHOS 71  BILITOT 0.8  PROT 7.2  ALBUMIN 4.4   No results for input(s): "LIPASE", "AMYLASE" in the last 168 hours. No results for input(s): "AMMONIA" in the last 168 hours. Coagulation profile Recent Labs  Lab 07/01/23 1003  INR 0.9    CBC: Recent Labs  Lab 07/01/23 1003  WBC 7.6  NEUTROABS 4.4  HGB 17.0  HCT 49.3  MCV 86.5  PLT 281   Cardiac Enzymes: No results for input(s): "CKTOTAL", "CKMB", "CKMBINDEX", "TROPONINI" in the last 168 hours. BNP (last 3 results) No results for input(s): "PROBNP" in the last 8760 hours. CBG: Recent Labs  Lab 07/01/23 0951  GLUCAP 110*   D-Dimer: No results for input(s): "DDIMER" in the last 72 hours. Hgb A1c: No results for input(s): "HGBA1C" in the last 72 hours. Lipid Profile: Recent Labs    07/02/23 0342  CHOL 201*  HDL 42  LDLCALC 86  TRIG 367*  CHOLHDL 4.8   Thyroid function studies: No results for input(s): "TSH", "T4TOTAL", "T3FREE", "THYROIDAB" in the last 72 hours.  Invalid input(s): "FREET3" Anemia work up: No results for input(s): "VITAMINB12", "FOLATE", "FERRITIN", "TIBC", "IRON", "RETICCTPCT" in the last 72 hours. Sepsis Labs: Recent Labs  Lab 07/01/23 1003  WBC 7.6    Microbiology No results found for this or any previous visit (from the past 240 hours).  Procedures and diagnostic studies:  CT ANGIO HEAD NECK W WO CM Result Date: 07/01/2023 CLINICAL DATA:  Stroke/TIA, determine embolic source EXAM: CT ANGIOGRAPHY HEAD AND NECK WITH AND WITHOUT CONTRAST TECHNIQUE: Multidetector CT imaging of the head and neck was performed using the standard protocol during bolus administration of intravenous  contrast. Multiplanar CT image reconstructions and MIPs were obtained to evaluate the vascular anatomy. Carotid stenosis measurements (when applicable) are obtained utilizing NASCET criteria, using the distal internal carotid diameter as the denominator. RADIATION DOSE REDUCTION: This exam was performed according to the departmental dose-optimization program which includes automated exposure control, adjustment of the mA and/or kV according to patient size and/or use  of iterative reconstruction technique. CONTRAST:  75mL OMNIPAQUE  IOHEXOL  350 MG/ML SOLN COMPARISON:  None Available. FINDINGS: There is initially greater enhancement of the pulmonary artery than aorta. Imaging repeated with improvement in head and neck vessel visualization. CTA NECK Aortic arch: Unremarkable.  Patent great vessel origins. Right carotid system: Patent. Mixed plaque along the proximal ICA with minimal stenosis. Left carotid system: Patent. Mixed plaque at the distal common carotid and bifurcation with minimal stenosis. Vertebral arteries: Patent. Left vertebral is dominant. No stenosis. Skeleton: No significant abnormality. Other neck: No acute abnormality. Upper chest: Included upper lungs are clear. Review of the MIP images confirms the above findings CTA HEAD Anterior circulation: Internal carotids are patent. Anterior and middle cerebral arteries are patent. Posterior circulation: Vertebral arteries are patent. The right vertebral appears to terminate as a PICA. Basilar artery is patent. Posterior cerebral arteries are patent. Bilateral posterior communicating arteries are present. Venous sinuses: Patent as allowed by contrast bolus timing. Review of the MIP images confirms the above findings IMPRESSION: No large vessel occlusion, hemodynamically significant stenosis, or evidence of dissection. Mild mixed plaque near the carotid bifurcations. Electronically Signed   By: Geannie Keener M.D.   On: 07/01/2023 13:42   MR BRAIN WO  CONTRAST Result Date: 07/01/2023 CLINICAL DATA:  Neuro deficit, acute, stroke suspected EXAM: MRI HEAD WITHOUT CONTRAST TECHNIQUE: Multiplanar, multiecho pulse sequences of the brain and surrounding structures were obtained without intravenous contrast. COMPARISON:  2018 FINDINGS: Brain: There is no acute infarction or intracranial hemorrhage. There is no intracranial mass, mass effect, or edema. There is no hydrocephalus or extra-axial fluid collection. Ventricles and sulci are normal in size and configuration. Minimal punctate foci of T2 hyperintensity in the supratentorial white matter probably reflect nonspecific gliosis/demyelination of doubtful significance. Vascular: Major vessel flow voids at the skull base are preserved. Skull and upper cervical spine: Normal marrow signal is preserved. Sinuses/Orbits: Paranasal sinuses are aerated. Orbits are unremarkable. Other: Sella is unremarkable.  Mastoid air cells are clear. IMPRESSION: No acute infarction, hemorrhage, or mass. Electronically Signed   By: Geannie Keener M.D.   On: 07/01/2023 12:43   CT HEAD CODE STROKE WO CONTRAST Result Date: 07/01/2023 CLINICAL DATA:  Code stroke. EXAM: CT HEAD WITHOUT CONTRAST TECHNIQUE: Contiguous axial images were obtained from the base of the skull through the vertex without intravenous contrast. RADIATION DOSE REDUCTION: This exam was performed according to the departmental dose-optimization program which includes automated exposure control, adjustment of the mA and/or kV according to patient size and/or use of iterative reconstruction technique. COMPARISON:  04/17/2016 FINDINGS: Brain: No evidence of acute infarction, hemorrhage, hydrocephalus, extra-axial collection or mass lesion/mass effect. Vascular: No hyperdense vessel or unexpected calcification. Skull: Normal. Negative for fracture or focal lesion. Sinuses/Orbits: No acute finding. Other: Prelim sent in epic chat ASPECTS [facility] Stroke Program Early CT Score) -  Ganglionic level infarction (caudate, lentiform nuclei, internal capsule, insula, M1-M3 cortex): 7 - Supraganglionic infarction (M4-M6 cortex): 3 Total score (0-10 with 10 being normal): 10 IMPRESSION: 1. Negative head CT. 2. ASPECTS is 10. Electronically Signed   By: Ronnette Coke M.D.   On: 07/01/2023 10:07               LOS: 0 days   Cherina Dhillon  [facility]   Pager on [email or web address]. If 7PM-7AM, please contact night-coverage at [email or web address]     07/02/2023, 3:47 PM

## 2023-07-02 NOTE — Progress Notes (Signed)
 SLP Cancellation Note  Patient Details Name: Fernando Goodwin MRN: [id number] DOB: 1965-04-26   Cancelled treatment:       Reason Eval/Treat Not Completed: SLP screened, no needs identified, will sign off   Zaheer Wageman 07/02/2023, 2:10 PM

## 2023-07-02 NOTE — Progress Notes (Signed)
 Patient arrived to unit during night shift, via bed.  Remains free from any noted signs of acute distress.  No noted signs of acute cardiac functioning noted during RN assessment.  RN able to perform general assessment.  Skin remains intact.  Lung fields remain clear throughout.  Has denied any recent falls/injury.  Remains A/O x4.  PIV to right hand and (L) AC flush without difficulty.  No noted signs of edema to bilateral LE's.  RN reviewed admission information with patient.  Patient has verbalized understanding of information reviewed.  Patient to continued to be monitored until discharged.

## 2023-07-02 NOTE — Discharge Summary (Incomplete)
 Physician Discharge Summary   Patient: Fernando Goodwin MRN: [id number] DOB: Apr 25, 1965  Admit date:     07/01/2023  Discharge date: {dischdate:26783}  Discharge Physician: Sheril Dines   PCP: Antonio Baumgarten, MD   Recommendations at discharge:  {Tip this will not be part of the note when signed- Example include specific recommendations for outpatient follow-up, pending tests to follow-up on. (Optional):26781}  ***  Discharge Diagnoses: Principal Problem:   CVA (cerebral vascular accident) (HCC) Active Problems:   TIA (transient ischemic attack)   Essential hypertension   ETOH abuse   Depression  Resolved Problems:   * No resolved hospital problems. *  Hospital Course: No notes on file  Assessment and Plan: TIA (transient ischemic attack) Noted transient episode of aphasia and left-sided weakness from earlier today with noted prior history of TIA in 2015 CT head within normal limits Stable neuroexam on evaluation at present Will plan for formal TIA evaluation including MRI of the brain, 2D echo, CTA of the head and neck, risk stratification Antiplatelet regimen per neurology recommendations High-dose statin Monitor   Depression Mood fairly stable  Cont paxil    ETOH abuse No reported active ETOH use    Essential hypertension Allow for permissive HTN in setting of CVA evaluation  Prn IV labetalol  for SBP>220 or DBP>110      Lipitotr and crestor caused myalgia. Willing to try Simvastatin    {Tip this will not be part of the note when signed Body mass index is 31.84 kg/m. , ,  (Optional):26781}  {(NOTE) Pain control PDMP Statment (Optional):26782} Consultants: *** Procedures performed: ***  Disposition: {Plan; Disposition:26390} Diet recommendation:  {Diet_Plan:26776} DISCHARGE MEDICATION: Allergies as of 07/02/2023       Reactions   Oxycodone  Nausea Only   Can tolerate with nausea medications     Med Rec must be completed prior to using this  [facility]***       Discharge Exam: Filed Weights   07/01/23 1208 07/01/23 2353  Weight: 99.6 kg 95 kg   ***  Condition at discharge: {DC Condition:26389}  The results of significant diagnostics from this hospitalization (including imaging, microbiology, ancillary and laboratory) are listed below for reference.   Imaging Studies: CT ANGIO HEAD NECK W WO CM Result Date: 07/01/2023 CLINICAL DATA:  Stroke/TIA, determine embolic source EXAM: CT ANGIOGRAPHY HEAD AND NECK WITH AND WITHOUT CONTRAST TECHNIQUE: Multidetector CT imaging of the head and neck was performed using the standard protocol during bolus administration of intravenous contrast. Multiplanar CT image reconstructions and MIPs were obtained to evaluate the vascular anatomy. Carotid stenosis measurements (when applicable) are obtained utilizing NASCET criteria, using the distal internal carotid diameter as the denominator. RADIATION DOSE REDUCTION: This exam was performed according to the departmental dose-optimization program which includes automated exposure control, adjustment of the mA and/or kV according to patient size and/or use of iterative reconstruction technique. CONTRAST:  75mL OMNIPAQUE  IOHEXOL  350 MG/ML SOLN COMPARISON:  None Available. FINDINGS: There is initially greater enhancement of the pulmonary artery than aorta. Imaging repeated with improvement in head and neck vessel visualization. CTA NECK Aortic arch: Unremarkable.  Patent great vessel origins. Right carotid system: Patent. Mixed plaque along the proximal ICA with minimal stenosis. Left carotid system: Patent. Mixed plaque at the distal common carotid and bifurcation with minimal stenosis. Vertebral arteries: Patent. Left vertebral is dominant. No stenosis. Skeleton: No significant abnormality. Other neck: No acute abnormality. Upper chest: Included upper lungs are clear. Review of the MIP images confirms the above findings CTA HEAD  Anterior circulation: Internal  carotids are patent. Anterior and middle cerebral arteries are patent. Posterior circulation: Vertebral arteries are patent. The right vertebral appears to terminate as a PICA. Basilar artery is patent. Posterior cerebral arteries are patent. Bilateral posterior communicating arteries are present. Venous sinuses: Patent as allowed by contrast bolus timing. Review of the MIP images confirms the above findings IMPRESSION: No large vessel occlusion, hemodynamically significant stenosis, or evidence of dissection. Mild mixed plaque near the carotid bifurcations. Electronically Signed   By: Geannie Keener M.D.   On: 07/01/2023 13:42   MR BRAIN WO CONTRAST Result Date: 07/01/2023 CLINICAL DATA:  Neuro deficit, acute, stroke suspected EXAM: MRI HEAD WITHOUT CONTRAST TECHNIQUE: Multiplanar, multiecho pulse sequences of the brain and surrounding structures were obtained without intravenous contrast. COMPARISON:  2018 FINDINGS: Brain: There is no acute infarction or intracranial hemorrhage. There is no intracranial mass, mass effect, or edema. There is no hydrocephalus or extra-axial fluid collection. Ventricles and sulci are normal in size and configuration. Minimal punctate foci of T2 hyperintensity in the supratentorial white matter probably reflect nonspecific gliosis/demyelination of doubtful significance. Vascular: Major vessel flow voids at the skull base are preserved. Skull and upper cervical spine: Normal marrow signal is preserved. Sinuses/Orbits: Paranasal sinuses are aerated. Orbits are unremarkable. Other: Sella is unremarkable.  Mastoid air cells are clear. IMPRESSION: No acute infarction, hemorrhage, or mass. Electronically Signed   By: Geannie Keener M.D.   On: 07/01/2023 12:43   CT HEAD CODE STROKE WO CONTRAST Result Date: 07/01/2023 CLINICAL DATA:  Code stroke. EXAM: CT HEAD WITHOUT CONTRAST TECHNIQUE: Contiguous axial images were obtained from the base of the skull through the vertex without  intravenous contrast. RADIATION DOSE REDUCTION: This exam was performed according to the departmental dose-optimization program which includes automated exposure control, adjustment of the mA and/or kV according to patient size and/or use of iterative reconstruction technique. COMPARISON:  04/17/2016 FINDINGS: Brain: No evidence of acute infarction, hemorrhage, hydrocephalus, extra-axial collection or mass lesion/mass effect. Vascular: No hyperdense vessel or unexpected calcification. Skull: Normal. Negative for fracture or focal lesion. Sinuses/Orbits: No acute finding. Other: Prelim sent in epic chat ASPECTS [facility] Stroke Program Early CT Score) - Ganglionic level infarction (caudate, lentiform nuclei, internal capsule, insula, M1-M3 cortex): 7 - Supraganglionic infarction (M4-M6 cortex): 3 Total score (0-10 with 10 being normal): 10 IMPRESSION: 1. Negative head CT. 2. ASPECTS is 10. Electronically Signed   By: Ronnette Coke M.D.   On: 07/01/2023 10:07    Microbiology: Results for orders placed or performed during the hospital encounter of 01/27/21  Resp Panel by RT-PCR (Flu A&B, Covid) Nasopharyngeal Swab     Status: None   Collection Time: 01/27/21  4:24 AM   Specimen: Nasopharyngeal Swab; Nasopharyngeal(NP) swabs in vial transport medium  Result Value Ref Range Status   SARS Coronavirus 2 by RT PCR NEGATIVE NEGATIVE Final    Comment: (NOTE) SARS-CoV-2 target nucleic acids are NOT DETECTED.  The SARS-CoV-2 RNA is generally detectable in upper respiratory specimens during the acute phase of infection. The lowest concentration of SARS-CoV-2 viral copies this assay can detect is 138 copies/mL. A negative result does not preclude SARS-Cov-2 infection and should not be used as the sole basis for treatment or other patient management decisions. A negative result may occur with  improper specimen collection/handling, submission of specimen other than nasopharyngeal swab, presence of viral  mutation(s) within the areas targeted by this assay, and inadequate number of viral copies(<138 copies/mL). A negative result must be  combined with clinical observations, patient history, and epidemiological information. The expected result is Negative.  Fact Sheet for Patients:  BloggerCourse.com  Fact Sheet for Healthcare Providers:  SeriousBroker.it  This test is no t yet approved or cleared by the [place]  FDA and  has been authorized for detection and/or diagnosis of SARS-CoV-2 by FDA under an Emergency Use Authorization (EUA). This EUA will remain  in effect (meaning this test can be used) for the duration of the COVID-19 declaration under Section 564(b)(1) of the Act, 21 U.S.C.section 360bbb-3(b)(1), unless the authorization is terminated  or revoked sooner.       Influenza A by PCR NEGATIVE NEGATIVE Final   Influenza B by PCR NEGATIVE NEGATIVE Final    Comment: (NOTE) The Xpert Xpress SARS-CoV-2/FLU/RSV plus assay is intended as an aid in the diagnosis of influenza from Nasopharyngeal swab specimens and should not be used as a sole basis for treatment. Nasal washings and aspirates are unacceptable for Xpert Xpress SARS-CoV-2/FLU/RSV testing.  Fact Sheet for Patients: BloggerCourse.com  Fact Sheet for Healthcare Providers: SeriousBroker.it  This test is not yet approved or cleared by the [place]  FDA and has been authorized for detection and/or diagnosis of SARS-CoV-2 by FDA under an Emergency Use Authorization (EUA). This EUA will remain in effect (meaning this test can be used) for the duration of the COVID-19 declaration under Section 564(b)(1) of the Act, 21 U.S.C. section 360bbb-3(b)(1), unless the authorization is terminated or revoked.  Performed at [facility], [address] [place], [place] [address]     Labs: CBC: Recent Labs   Lab 07/01/23 1003  WBC 7.6  NEUTROABS 4.4  HGB 17.0  HCT 49.3  MCV 86.5  PLT 281   Basic Metabolic Panel: Recent Labs  Lab 07/01/23 1003  NA 135  K 4.0  CL 102  CO2 22  GLUCOSE 109*  BUN 9  CREATININE 0.78  CALCIUM  9.2   Liver Function Tests: Recent Labs  Lab 07/01/23 1003  AST 26  ALT 31  ALKPHOS 71  BILITOT 0.8  PROT 7.2  ALBUMIN 4.4   CBG: No results for input(s): "GLUCAP" in the last 168 hours.  Discharge time spent: {LESS THAN/GREATER UEAV:40981} 30 minutes.  Signed: Sheril Dines, MD [facility] 07/02/2023

## 2023-07-02 NOTE — Procedures (Signed)
 Patient Name: DEWIE KOEPKE  MRN: [id number]  Epilepsy Attending: Arleene Lack  Referring Physician/Provider: Eleni Griffin, MD  Date: 07/02/2023 Duration: 26.19 mins  Patient history: 58 year old gentleman with difficulty speaking and left-sided weakness. EEG to evaluate for seizure.  Level of alertness: Awake  AEDs during EEG study: None  Technical aspects: This EEG study was done with scalp electrodes positioned according to the 10-20 International system of electrode placement. Electrical activity was reviewed with band pass filter of 1-70Hz , sensitivity of 7 uV/mm, display speed of 27mm/sec with a 60Hz  notched filter applied as appropriate. EEG data were recorded continuously and digitally stored.  Video monitoring was available and reviewed as appropriate.  Description: The posterior dominant rhythm consists of 9-10 Hz activity of moderate voltage (25-35 uV) seen predominantly in posterior head regions, symmetric and reactive to eye opening and eye closing. There is 15 to 18 Hz beta activity distributed symmetrically and diffusely. Hyperventilation and photic stimulation were not performed.     IMPRESSION: This study is within normal limits. No seizures or epileptiform discharges were seen throughout the recording.  A normal interictal EEG does not exclude the diagnosis of epilepsy.   Nakesha Ebrahim O Jvion Turgeon

## 2023-07-02 NOTE — Progress Notes (Signed)
 Eeg done

## 2023-07-02 NOTE — TOC Progression Note (Signed)
 Transition of Care [facility]) - Progression Note    Patient Details  Name: Fernando Goodwin MRN: [id number] Date of Birth: 1965-06-24  [facility] CM/SW Contact  Crayton Docker, RN Phone Number: 07/02/2023, 5:06 PM  Clinical Narrative:     Alert received from Dr. Leory Rands regarding discharge care planning. Hospital day 1 with diagnosis of TIA. Per Dr. Leory Rands, discharge is pending completion of all tests. CM will continue to follow.  Expected Discharge Plan and Services     TBD       Social Determinants of Health (SDOH) Interventions SDOH Screenings   Food Insecurity: No Food Insecurity (07/01/2023)  Housing: Low Risk  (07/01/2023)  Transportation Needs: No Transportation Needs (07/01/2023)  Utilities: Not At Risk (07/01/2023)  Financial Resource Strain: Low Risk  (06/07/2023)   Received from [facility]  Physical Activity: Sufficiently Active (11/05/2018)   Received from [facility], [facility]  Social Connections: Unknown (07/01/2023)  Stress: No Stress Concern Present (11/05/2018)   Received from [facility], [facility]  Tobacco Use: Medium Risk (07/01/2023)    Readmission Risk Interventions     No data to display

## 2023-07-03 ENCOUNTER — Observation Stay (HOSPITAL_BASED_OUTPATIENT_CLINIC_OR_DEPARTMENT_OTHER)
Admit: 2023-07-03 | Discharge: 2023-07-03 | Disposition: A | Discharge status: Home / Self Care | Attending: Family Medicine | Admitting: Family Medicine

## 2023-07-03 DIAGNOSIS — G459 Transient cerebral ischemic attack, unspecified: Secondary | ICD-10-CM

## 2023-07-03 LAB — ECHOCARDIOGRAM COMPLETE
AR max vel: 2.27 cm2
AV Area VTI: 2.52 cm2
AV Area mean vel: 2.1 cm2
AV Mean grad: 4 mmHg
AV Peak grad: 6.7 mmHg
Ao pk vel: 1.29 m/s
Area-P 1/2: 2.59 cm2
Height: 68 in
MV VTI: 1.75 cm2
S' Lateral: 3.1 cm
Weight: 3350.99 [oz_av]

## 2023-07-03 MED ORDER — SIMVASTATIN 20 MG PO TABS: 20.0000 mg | ORAL_TABLET | Freq: Every day | ORAL | 0 refills | Status: AC

## 2023-07-03 MED ORDER — ASPIRIN 81 MG PO TBEC: 81.0000 mg | DELAYED_RELEASE_TABLET | Freq: Every day | ORAL | Status: AC

## 2023-07-03 NOTE — Plan of Care (Signed)
 EEG: Normal  MRI brain: No acute infarction, hemorrhage, or mass.   CTA of head and neck: No large vessel occlusion, hemodynamically significant stenosis, or evidence of dissection. Mild mixed plaque near the carotid bifurcations.  TTE with bubble study is pending.    Electronically signed: Dr. Wilkins Elpers

## 2023-07-03 NOTE — Plan of Care (Signed)
 Fernando Goodwin

## 2023-07-03 NOTE — TOC Transition Note (Signed)
 Transition of Care [facility]) - Discharge Note   Patient Details  Name: Fernando Goodwin MRN: [id number] Date of Birth: 07-18-1965  Transition of Care [facility] CM/SW Contact:  Crayton Docker, RN 07/03/2023, 1:42 PM   Clinical Narrative:     Discharge orders noted for home/self care. Private transportation arranged. No identified needs.   Final next level of care: Home/Self Care Barriers to Discharge: No Barriers Identified   Patient Goals and CMS Choice    Home/self care  Discharge Placement      Home/self care          Discharge Plan and Services Additional resources added to the After Visit Summary for        Home/self care           Social Drivers of Health (SDOH) Interventions SDOH Screenings   Food Insecurity: No Food Insecurity (07/01/2023)  Housing: Low Risk  (07/01/2023)  Transportation Needs: No Transportation Needs (07/01/2023)  Utilities: Not At Risk (07/01/2023)  Financial Resource Strain: Low Risk  (06/07/2023)   Received from [facility]  Physical Activity: Sufficiently Active (11/05/2018)   Received from [facility], [facility]  Social Connections: Unknown (07/01/2023)  Stress: No Stress Concern Present (11/05/2018)   Received from [facility], [facility]  Tobacco Use: Medium Risk (07/01/2023)     Readmission Risk Interventions     No data to display

## 2023-07-03 NOTE — Plan of Care (Signed)
 Tamea Faith, LPN

## 2023-07-03 NOTE — Progress Notes (Signed)
*  PRELIMINARY RESULTS* Echocardiogram 2D Echocardiogram has been performed.  Fernando Goodwin 07/03/2023, 8:57 AM

## 2023-07-03 NOTE — Discharge Summary (Signed)
 Physician Discharge Summary   Patient: Fernando Goodwin MRN: [id number] DOB: 1966-01-19  Admit date:     07/01/2023  Discharge date: 07/03/23  Discharge Physician: Sheril Dines   PCP: Antonio Baumgarten, MD   Recommendations at discharge:  {Tip this will not be part of the note when signed- Example include specific recommendations for outpatient follow-up, pending tests to follow-up on. (Optional):26781}  ***  Discharge Diagnoses: Principal Problem:   TIA (transient ischemic attack) Active Problems:   Essential hypertension   ETOH abuse   Depression  Resolved Problems:   * No resolved hospital problems. *  Hospital Course:  Fernando Goodwin is a 58 y.o. male with medical history significant for hypertension, depression, asthma, history of TIA, who presented to the hospital because of aphasia and left-sided weakness.  His wife described it as a staring spell during which patient was unable to speak.  He said symptoms lasted only about 20 minutes and by the time he arrived in the ED, symptoms had resolved.     MRI brain did not show any evidence of stroke and CTA head and neck did not show any large vessel occlusion.  He was admitted to the hospital for TIA.     Assessment and Plan:   TIA: MRI brain did not show any evidence of acute stroke.  CTA head and neck did not show any large vessel occlusion. She has been seen by Dr. Sandrea Cruel, neurologist.  Dual antiplatelet therapy with aspirin  and Plavix  for 21 days followed by Plavix  monotherapy. He did well with PT and OT. EEG was unremarkable. 2D echo showed EF estimated at 60 to 65%, grade 1 diastolic dysfunction, bubble study was nondiagnostic. Outpatient follow-up with neurology as recommended    Dyslipidemia: Total cholesterol 201, triglycerides 367, HDL 42, LDL 86. Patient will also be started on simvastatin.  Of note, he said he had tried rosuvastatin and atorvastatin  in the past but he discontinued them because of myalgia.   He is willing to try low-dose simvastatin. Hemoglobin A1c is 5.6     Hypertension: Continue amlodipine  and losartan      Depression: Continue Paxil     His condition has improved and is stable stable for discharge home today.  Discharge plan was discussed with patient and his wife at the bedside.   {Tip this will not be part of the note when signed Body mass index is 31.84 kg/m. , ,  (Optional):26781}   Consultants: Neurologist Procedures performed: None  Disposition: Home Diet recommendation:  Discharge Diet Orders (From admission, onward)     Start     Ordered   07/03/23 0000  Diet - low sodium heart healthy        07/03/23 1241           Cardiac diet DISCHARGE MEDICATION: Allergies as of 07/03/2023       Reactions   Oxycodone  Nausea Only   Can tolerate with nausea medications        Medication List     STOP taking these medications    losartan -hydrochlorothiazide  100-12.5 MG tablet Commonly known as: HYZAAR   losartan -hydrochlorothiazide  100-25 MG tablet Commonly known as: HYZAAR       TAKE these medications    amLODipine  10 MG tablet Commonly known as: NORVASC  Take 10 mg by mouth daily.   aspirin  EC 81 MG tablet Take 1 tablet (81 mg total) by mouth daily for 20 days. Swallow whole. Start taking on: Jul 04, 2023   cetirizine 10 MG tablet  Commonly known as: ZYRTEC Take 10 mg by mouth daily.   clopidogrel  75 MG tablet Commonly known as: PLAVIX  Take 1 tablet (75 mg total) by mouth daily.   COQ10 PO Take 1 capsule by mouth daily.   losartan  100 MG tablet Commonly known as: COZAAR  Take 100 mg by mouth daily.   nitroGLYCERIN  0.4 MG SL tablet Commonly known as: Nitrostat  Place 1 tablet (0.4 mg total) under the tongue every 5 (five) minutes as needed for chest pain.   PARoxetine  10 MG tablet Commonly known as: PAXIL  Take 10 mg by mouth daily. What changed: Another medication with the same name was removed. Continue taking this  medication, and follow the directions you see here.   simvastatin 20 MG tablet Commonly known as: ZOCOR Take 1 tablet (20 mg total) by mouth daily at 6 PM.        Follow-up Information     Antonio Baumgarten, MD Follow up.   Specialty: Internal Medicine Why: Hospital follow up Contact information: [address] [place] [address] [phone]                Discharge Exam: Fernando Goodwin Weights   07/01/23 1208 07/01/23 2353  Weight: 99.6 kg 95 kg   GEN: NAD SKIN: Warm and dry EYES: No pallor or icterus ENT: MMM CV: RRR PULM: CTA B ABD: soft, ND, NT, +BS CNS: AAO x 3, non focal EXT: No edema or tenderness   Condition at discharge: good  The results of significant diagnostics from this hospitalization (including imaging, microbiology, ancillary and laboratory) are listed below for reference.   Imaging Studies: ECHOCARDIOGRAM COMPLETE Result Date: 07/03/2023    ECHOCARDIOGRAM REPORT   Patient Name:   Fernando Goodwin Date of Exam: 07/03/2023 Medical Rec #:  [id number]       Height:       68.0 in Accession #:    [id number]      Weight:       209.4 lb Date of Birth:  October 17, 1965       BSA:          2.084 m Patient Age:    57 years        BP:           131/76 mmHg Patient Gender: M               HR:           69 bpm. Exam Location:  [facility] Procedure: 2D Echo, Cardiac Doppler, Color Doppler and Saline Contrast Bubble            Study (Both Spectral and Color Flow Doppler were utilized during            procedure). Indications:     TIA G45.9  History:         Patient has no prior history of Echocardiogram examinations,                  most recent 08/16/2016.  Sonographer:     Broadus Canes Referring Phys:  [id number] Alayne Allis NEWTON Diagnosing Phys: Sammy Crisp MD IMPRESSIONS  1. Left ventricular ejection fraction, by estimation, is 60 to 65%. The left ventricle has normal function. The left ventricle has no regional wall motion abnormalities. Left ventricular diastolic parameters are  consistent with Grade I diastolic dysfunction (impaired relaxation).  2. Right ventricular systolic function is normal. The right ventricular size is normal.  3. The mitral valve is normal in structure. Trivial  mitral valve regurgitation. No evidence of mitral stenosis.  4. The aortic valve is tricuspid. Aortic valve regurgitation is not visualized. No aortic stenosis is present.  5. Bubble study is non-diagnostic. FINDINGS  Left Ventricle: Left ventricular ejection fraction, by estimation, is 60 to 65%. The left ventricle has normal function. The left ventricle has no regional wall motion abnormalities. The left ventricular internal cavity size was normal in size. There is  no left ventricular hypertrophy. Left ventricular diastolic parameters are consistent with Grade I diastolic dysfunction (impaired relaxation). Right Ventricle: The right ventricular size is normal. No increase in right ventricular wall thickness. Right ventricular systolic function is normal. Left Atrium: Left atrial size was normal in size. Right Atrium: Right atrial size was normal in size. Pericardium: There is no evidence of pericardial effusion. Mitral Valve: The mitral valve is normal in structure. Trivial mitral valve regurgitation. No evidence of mitral valve stenosis. MV peak gradient, 5.7 mmHg. The mean mitral valve gradient is 3.0 mmHg. Tricuspid Valve: The tricuspid valve is normal in structure. Tricuspid valve regurgitation is trivial. Aortic Valve: The aortic valve is tricuspid. Aortic valve regurgitation is not visualized. No aortic stenosis is present. Aortic valve mean gradient measures 4.0 mmHg. Aortic valve peak gradient measures 6.7 mmHg. Aortic valve area, by VTI measures 2.52 cm. Pulmonic Valve: The pulmonic valve was grossly normal. Pulmonic valve regurgitation is not visualized. No evidence of pulmonic stenosis. Aorta: The aortic root is normal in size and structure. Pulmonary Artery: The pulmonary artery is not well  seen. IAS/Shunts: The interatrial septum was not well visualized. Agitated saline contrast was given intravenously to evaluate for intracardiac shunting. Bubble study is non-diagnostic.  LEFT VENTRICLE PLAX 2D LVIDd:         4.40 cm   Diastology LVIDs:         3.10 cm   LV e' medial:    7.72 cm/s LV PW:         0.90 cm   LV E/e' medial:  11.9 LV IVS:        0.75 cm   LV e' lateral:   9.36 cm/s LVOT diam:     2.00 cm   LV E/e' lateral: 9.9 LV SV:         59 LV SV Index:   28 LVOT Area:     3.14 cm  RIGHT VENTRICLE RV Basal diam:  3.30 cm RV Mid diam:    2.50 cm RV S prime:     14.40 cm/s TAPSE (M-mode): 2.5 cm LEFT ATRIUM           Index        RIGHT ATRIUM           Index LA diam:      3.00 cm 1.44 cm/m   RA Area:     14.30 cm LA Vol (A2C): 31.0 ml 14.87 ml/m  RA Volume:   32.10 ml  15.40 ml/m LA Vol (A4C): 40.8 ml 19.58 ml/m  AORTIC VALVE AV Area (Vmax):    2.27 cm AV Area (Vmean):   2.10 cm AV Area (VTI):     2.52 cm AV Vmax:           129.00 cm/s AV Vmean:          87.000 cm/s AV VTI:            0.234 m AV Peak Grad:      6.7 mmHg AV Mean Grad:      4.0  mmHg LVOT Vmax:         93.40 cm/s LVOT Vmean:        58.100 cm/s LVOT VTI:          0.188 m LVOT/AV VTI ratio: 0.80  AORTA Ao Root diam: 2.90 cm MITRAL VALVE                TRICUSPID VALVE MV Area (PHT): 2.59 cm     TR Peak grad:   13.0 mmHg MV Area VTI:   1.75 cm     TR Vmax:        180.00 cm/s MV Peak grad:  5.7 mmHg MV Mean grad:  3.0 mmHg     SHUNTS MV Vmax:       1.19 m/s     Systemic VTI:  0.19 m MV Vmean:      73.8 cm/s    Systemic Diam: 2.00 cm MV Decel Time: 293 msec MV E velocity: 92.20 cm/s MV A velocity: 108.00 cm/s MV E/A ratio:  0.85 Sammy Crisp MD Electronically signed by Sammy Crisp MD Signature Date/Time: 07/03/2023/9:09:00 AM    Final    EEG adult Result Date: 07/02/2023 Arleene Lack, MD     07/02/2023  3:47 PM Patient Name: BIRGER SYLVAIN MRN: [id number] Epilepsy Attending: Arleene Lack Referring Physician/Provider:  Eleni Griffin, MD Date: 07/02/2023 Duration: 26.19 mins Patient history: 58 year old gentleman with difficulty speaking and left-sided weakness. EEG to evaluate for seizure. Level of alertness: Awake AEDs during EEG study: None Technical aspects: This EEG study was done with scalp electrodes positioned according to the 10-20 International system of electrode placement. Electrical activity was reviewed with band pass filter of 1-70Hz , sensitivity of 7 uV/mm, display speed of 3mm/sec with a 60Hz  notched filter applied as appropriate. EEG data were recorded continuously and digitally stored.  Video monitoring was available and reviewed as appropriate. Description: The posterior dominant rhythm consists of 9-10 Hz activity of moderate voltage (25-35 uV) seen predominantly in posterior head regions, symmetric and reactive to eye opening and eye closing. There is 15 to 18 Hz beta activity distributed symmetrically and diffusely. Hyperventilation and photic stimulation were not performed.   IMPRESSION: This study is within normal limits. No seizures or epileptiform discharges were seen throughout the recording. A normal interictal EEG does not exclude the diagnosis of epilepsy. Arleene Lack   CT ANGIO HEAD NECK W WO CM Result Date: 07/01/2023 CLINICAL DATA:  Stroke/TIA, determine embolic source EXAM: CT ANGIOGRAPHY HEAD AND NECK WITH AND WITHOUT CONTRAST TECHNIQUE: Multidetector CT imaging of the head and neck was performed using the standard protocol during bolus administration of intravenous contrast. Multiplanar CT image reconstructions and MIPs were obtained to evaluate the vascular anatomy. Carotid stenosis measurements (when applicable) are obtained utilizing NASCET criteria, using the distal internal carotid diameter as the denominator. RADIATION DOSE REDUCTION: This exam was performed according to the departmental dose-optimization program which includes automated exposure control, adjustment of the mA  and/or kV according to patient size and/or use of iterative reconstruction technique. CONTRAST:  75mL OMNIPAQUE  IOHEXOL  350 MG/ML SOLN COMPARISON:  None Available. FINDINGS: There is initially greater enhancement of the pulmonary artery than aorta. Imaging repeated with improvement in head and neck vessel visualization. CTA NECK Aortic arch: Unremarkable.  Patent great vessel origins. Right carotid system: Patent. Mixed plaque along the proximal ICA with minimal stenosis. Left carotid system: Patent. Mixed plaque at the distal common carotid and bifurcation with minimal stenosis. Vertebral arteries: Patent.  Left vertebral is dominant. No stenosis. Skeleton: No significant abnormality. Other neck: No acute abnormality. Upper chest: Included upper lungs are clear. Review of the MIP images confirms the above findings CTA HEAD Anterior circulation: Internal carotids are patent. Anterior and middle cerebral arteries are patent. Posterior circulation: Vertebral arteries are patent. The right vertebral appears to terminate as a PICA. Basilar artery is patent. Posterior cerebral arteries are patent. Bilateral posterior communicating arteries are present. Venous sinuses: Patent as allowed by contrast bolus timing. Review of the MIP images confirms the above findings IMPRESSION: No large vessel occlusion, hemodynamically significant stenosis, or evidence of dissection. Mild mixed plaque near the carotid bifurcations. Electronically Signed   By: Geannie Keener M.D.   On: 07/01/2023 13:42   MR BRAIN WO CONTRAST Result Date: 07/01/2023 CLINICAL DATA:  Neuro deficit, acute, stroke suspected EXAM: MRI HEAD WITHOUT CONTRAST TECHNIQUE: Multiplanar, multiecho pulse sequences of the brain and surrounding structures were obtained without intravenous contrast. COMPARISON:  2018 FINDINGS: Brain: There is no acute infarction or intracranial hemorrhage. There is no intracranial mass, mass effect, or edema. There is no hydrocephalus or  extra-axial fluid collection. Ventricles and sulci are normal in size and configuration. Minimal punctate foci of T2 hyperintensity in the supratentorial white matter probably reflect nonspecific gliosis/demyelination of doubtful significance. Vascular: Major vessel flow voids at the skull base are preserved. Skull and upper cervical spine: Normal marrow signal is preserved. Sinuses/Orbits: Paranasal sinuses are aerated. Orbits are unremarkable. Other: Sella is unremarkable.  Mastoid air cells are clear. IMPRESSION: No acute infarction, hemorrhage, or mass. Electronically Signed   By: Geannie Keener M.D.   On: 07/01/2023 12:43   CT HEAD CODE STROKE WO CONTRAST Result Date: 07/01/2023 CLINICAL DATA:  Code stroke. EXAM: CT HEAD WITHOUT CONTRAST TECHNIQUE: Contiguous axial images were obtained from the base of the skull through the vertex without intravenous contrast. RADIATION DOSE REDUCTION: This exam was performed according to the departmental dose-optimization program which includes automated exposure control, adjustment of the mA and/or kV according to patient size and/or use of iterative reconstruction technique. COMPARISON:  04/17/2016 FINDINGS: Brain: No evidence of acute infarction, hemorrhage, hydrocephalus, extra-axial collection or mass lesion/mass effect. Vascular: No hyperdense vessel or unexpected calcification. Skull: Normal. Negative for fracture or focal lesion. Sinuses/Orbits: No acute finding. Other: Prelim sent in epic chat ASPECTS [facility] Stroke Program Early CT Score) - Ganglionic level infarction (caudate, lentiform nuclei, internal capsule, insula, M1-M3 cortex): 7 - Supraganglionic infarction (M4-M6 cortex): 3 Total score (0-10 with 10 being normal): 10 IMPRESSION: 1. Negative head CT. 2. ASPECTS is 10. Electronically Signed   By: Ronnette Coke M.D.   On: 07/01/2023 10:07    Microbiology: Results for orders placed or performed during the hospital encounter of 01/27/21  Resp Panel by  RT-PCR (Flu A&B, Covid) Nasopharyngeal Swab     Status: None   Collection Time: 01/27/21  4:24 AM   Specimen: Nasopharyngeal Swab; Nasopharyngeal(NP) swabs in vial transport medium  Result Value Ref Range Status   SARS Coronavirus 2 by RT PCR NEGATIVE NEGATIVE Final    Comment: (NOTE) SARS-CoV-2 target nucleic acids are NOT DETECTED.  The SARS-CoV-2 RNA is generally detectable in upper respiratory specimens during the acute phase of infection. The lowest concentration of SARS-CoV-2 viral copies this assay can detect is 138 copies/mL. A negative result does not preclude SARS-Cov-2 infection and should not be used as the sole basis for treatment or other patient management decisions. A negative result may occur with  improper  specimen collection/handling, submission of specimen other than nasopharyngeal swab, presence of viral mutation(s) within the areas targeted by this assay, and inadequate number of viral copies(<138 copies/mL). A negative result must be combined with clinical observations, patient history, and epidemiological information. The expected result is Negative.  Fact Sheet for Patients:  BloggerCourse.com  Fact Sheet for Healthcare Providers:  SeriousBroker.it  This test is no t yet approved or cleared by the [place]  FDA and  has been authorized for detection and/or diagnosis of SARS-CoV-2 by FDA under an Emergency Use Authorization (EUA). This EUA will remain  in effect (meaning this test can be used) for the duration of the COVID-19 declaration under Section 564(b)(1) of the Act, 21 U.S.C.section 360bbb-3(b)(1), unless the authorization is terminated  or revoked sooner.       Influenza A by PCR NEGATIVE NEGATIVE Final   Influenza B by PCR NEGATIVE NEGATIVE Final    Comment: (NOTE) The Xpert Xpress SARS-CoV-2/FLU/RSV plus assay is intended as an aid in the diagnosis of influenza from Nasopharyngeal swab  specimens and should not be used as a sole basis for treatment. Nasal washings and aspirates are unacceptable for Xpert Xpress SARS-CoV-2/FLU/RSV testing.  Fact Sheet for Patients: BloggerCourse.com  Fact Sheet for Healthcare Providers: SeriousBroker.it  This test is not yet approved or cleared by the [place]  FDA and has been authorized for detection and/or diagnosis of SARS-CoV-2 by FDA under an Emergency Use Authorization (EUA). This EUA will remain in effect (meaning this test can be used) for the duration of the COVID-19 declaration under Section 564(b)(1) of the Act, 21 U.S.C. section 360bbb-3(b)(1), unless the authorization is terminated or revoked.  Performed at [facility], [address] [place], [place] [address]     Labs: CBC: Recent Labs  Lab 07/01/23 1003  WBC 7.6  NEUTROABS 4.4  HGB 17.0  HCT 49.3  MCV 86.5  PLT 281   Basic Metabolic Panel: Recent Labs  Lab 07/01/23 1003  NA 135  K 4.0  CL 102  CO2 22  GLUCOSE 109*  BUN 9  CREATININE 0.78  CALCIUM  9.2   Liver Function Tests: Recent Labs  Lab 07/01/23 1003  AST 26  ALT 31  ALKPHOS 71  BILITOT 0.8  PROT 7.2  ALBUMIN 4.4   CBG: Recent Labs  Lab 07/01/23 0951  GLUCAP 110*    Discharge time spent: greater than 30 minutes.  Signed: Sheril Dines, MD [facility] 07/03/2023

## 2023-08-23 ENCOUNTER — Ambulatory Visit: Payer: PRIVATE HEALTH INSURANCE | Admitting: Neurosurgery

## 2023-11-22 ENCOUNTER — Encounter: Payer: Self-pay | Admitting: Dermatology

## 2023-11-22 ENCOUNTER — Ambulatory Visit (INDEPENDENT_AMBULATORY_CARE_PROVIDER_SITE_OTHER): Admitting: Dermatology

## 2023-11-22 DIAGNOSIS — L739 Follicular disorder, unspecified: Secondary | ICD-10-CM

## 2023-11-22 DIAGNOSIS — S0001XA Abrasion of scalp, initial encounter: Secondary | ICD-10-CM | POA: Diagnosis not present

## 2023-11-22 DIAGNOSIS — T148XXA Other injury of unspecified body region, initial encounter: Secondary | ICD-10-CM

## 2023-11-22 NOTE — Progress Notes (Unsigned)
   New Patient Visit   Subjective  Fernando Goodwin is a 58 y.o. male who presents for the following: check spot scalp ~72m, tender when pressed on, pt has been scratching at it, hx of BCC abdomen ~1990, check face and scalp  Patient accompanied by wife  The following portions of the chart were reviewed this encounter and updated as appropriate: medications, allergies, medical history  Review of Systems:  No other skin or systemic complaints except as noted in HPI or Assessment and Plan.  Objective  Well appearing patient in no apparent distress; mood and affect are within normal limits.   A focused examination was performed of the following areas: Scalp, face, neck  Relevant exam findings are noted in the Assessment and Plan.    Assessment & Plan   FOLLICULITIS Exacerbated by scratching Crown scalp Exam: Excoriation at crown scalp  Treatment Plan: Recommend aquaphor/vaseline until healed. Call if not resolving.  Consider biopsy if not resolved in a month   HISTORY OF BASAL CELL CARCINOMA OF THE SKIN - No evidence of recurrence today - Recommend regular full body skin exams - Recommend daily broad spectrum sunscreen SPF 30+ to sun-exposed areas, reapply every 2 hours as needed.  - Call if any new or changing lesions are noted between office visits  - Abdomen   No follow-ups on file.  I, Grayce Saunas, RMA, am acting as scribe for Boneta Sharps, MD .   Documentation: I have reviewed the above documentation for accuracy and completeness, and I agree with the above.  Boneta Sharps, MD

## 2023-11-22 NOTE — Patient Instructions (Signed)

## 2023-11-23 ENCOUNTER — Encounter: Payer: Self-pay | Admitting: Dermatology

## 2024-01-01 ENCOUNTER — Other Ambulatory Visit
Admission: RE | Admit: 2024-01-01 | Discharge: 2024-01-01 | Disposition: A | Discharge status: Home / Self Care | Source: Ambulatory Visit

## 2024-01-01 DIAGNOSIS — R0789 Other chest pain: Secondary | ICD-10-CM | POA: Insufficient documentation

## 2024-01-01 LAB — TROPONIN T, HIGH SENSITIVITY: Troponin T High Sensitivity: <15 ng/L (ref 0–19)

## 2024-01-07 ENCOUNTER — Emergency Department

## 2024-01-07 ENCOUNTER — Other Ambulatory Visit: Payer: Self-pay

## 2024-01-07 ENCOUNTER — Encounter: Payer: Self-pay | Admitting: Emergency Medicine

## 2024-01-07 ENCOUNTER — Emergency Department
Admission: EM | Admit: 2024-01-07 | Discharge: 2024-01-07 | Disposition: A | Discharge status: Home / Self Care | Attending: Emergency Medicine | Admitting: Emergency Medicine

## 2024-01-07 DIAGNOSIS — R519 Headache, unspecified: Secondary | ICD-10-CM | POA: Insufficient documentation

## 2024-01-07 DIAGNOSIS — E875 Hyperkalemia: Secondary | ICD-10-CM | POA: Diagnosis not present

## 2024-01-07 DIAGNOSIS — R079 Chest pain, unspecified: Secondary | ICD-10-CM | POA: Diagnosis present

## 2024-01-07 DIAGNOSIS — I10 Essential (primary) hypertension: Secondary | ICD-10-CM | POA: Insufficient documentation

## 2024-01-07 LAB — COMPREHENSIVE METABOLIC PANEL WITH GFR
ALT: 31 U/L (ref 0–44)
AST: 52 U/L — ABNORMAL HIGH (ref 15–41)
Albumin: 4.5 g/dL (ref 3.5–5.0)
Alkaline Phosphatase: 94 U/L (ref 38–126)
Anion gap: 12 (ref 5–15)
BUN: 11 mg/dL (ref 6–20)
CO2: 22 mmol/L (ref 22–32)
Calcium: 9 mg/dL (ref 8.9–10.3)
Chloride: 103 mmol/L (ref 98–111)
Creatinine, Ser: 0.66 mg/dL (ref 0.61–1.24)
GFR, Estimated: >60 mL/min (ref >60)
Glucose, Bld: 109 mg/dL — ABNORMAL HIGH (ref 70–99)
Potassium: 6.1 mmol/L — ABNORMAL HIGH (ref 3.5–5.1)
Sodium: 137 mmol/L (ref 135–145)
Total Bilirubin: 0.5 mg/dL (ref 0.0–1.2)
Total Protein: 7.3 g/dL (ref 6.5–8.1)

## 2024-01-07 LAB — CBC
HCT: 51 % (ref 39.0–52.0)
Hemoglobin: 17.9 g/dL — ABNORMAL HIGH (ref 13.0–17.0)
MCH: 30 pg (ref 26.0–34.0)
MCHC: 35.1 g/dL (ref 30.0–36.0)
MCV: 85.6 fL (ref 80.0–100.0)
Platelets: 246 K/uL (ref 150–400)
RBC: 5.96 MIL/uL — ABNORMAL HIGH (ref 4.22–5.81)
RDW: 12.4 % (ref 11.5–15.5)
WBC: 8.6 K/uL (ref 4.0–10.5)
nRBC: 0 % (ref 0.0–0.2)

## 2024-01-07 LAB — BASIC METABOLIC PANEL WITH GFR
Anion gap: 8 (ref 5–15)
BUN: 11 mg/dL (ref 6–20)
CO2: 23 mmol/L (ref 22–32)
Calcium: 8.4 mg/dL — ABNORMAL LOW (ref 8.9–10.3)
Chloride: 106 mmol/L (ref 98–111)
Creatinine, Ser: 0.64 mg/dL (ref 0.61–1.24)
GFR, Estimated: >60 mL/min (ref >60)
Glucose, Bld: 99 mg/dL (ref 70–99)
Potassium: 4.3 mmol/L (ref 3.5–5.1)
Sodium: 137 mmol/L (ref 135–145)

## 2024-01-07 LAB — TROPONIN T, HIGH SENSITIVITY
Troponin T High Sensitivity: <15 ng/L (ref 0–19)
Troponin T High Sensitivity: <15 ng/L (ref 0–19)

## 2024-01-07 LAB — RESP PANEL BY RT-PCR (RSV, FLU A&B, COVID)  RVPGX2
Influenza A by PCR: NEGATIVE
Influenza B by PCR: NEGATIVE
Resp Syncytial Virus by PCR: NEGATIVE
SARS Coronavirus 2 by RT PCR: NEGATIVE

## 2024-01-07 LAB — LIPASE, BLOOD: Lipase: 43 U/L (ref 11–51)

## 2024-01-07 MED ORDER — PROCHLORPERAZINE EDISYLATE 10 MG/2ML IJ SOLN
10.0000 mg | Freq: Once | INTRAMUSCULAR | Status: AC
  Administered 2024-01-07: 10 mg via INTRAVENOUS
  Filled 2024-01-07: qty 2

## 2024-01-07 MED ORDER — ACETAMINOPHEN 325 MG PO TABS
650.0000 mg | ORAL_TABLET | Freq: Once | ORAL | Status: AC
  Administered 2024-01-07: 650 mg via ORAL
  Filled 2024-01-07: qty 2

## 2024-01-07 MED ORDER — SODIUM CHLORIDE 0.9 % IV BOLUS
1000.0000 mL | Freq: Once | INTRAVENOUS | Status: AC
  Administered 2024-01-07: 1000 mL via INTRAVENOUS

## 2024-01-07 MED ORDER — DIPHENHYDRAMINE HCL 50 MG/ML IJ SOLN
25.0000 mg | Freq: Once | INTRAMUSCULAR | Status: AC
  Administered 2024-01-07: 25 mg via INTRAVENOUS
  Filled 2024-01-07: qty 1

## 2024-01-07 NOTE — ED Provider Triage Note (Signed)
 Emergency Medicine Provider Triage Evaluation Note  Fernando Goodwin , a 58 y.o. male  was evaluated in triage.  Pt complains of chest pain, vomiting.  Started around 3:30 AM.  Has appoint with cardiology to establish care at 4 PM today.  Review of Systems  Positive: Chest pain, vomiting, anxiety Negative: Abdominal pain, shortness of breath, leg swelling  Physical Exam  BP (!) 162/96 (BP Location: Right Arm)   Pulse 75   Temp 98.3 F (36.8 C) (Oral)   Resp 20   Ht 5' 8 (1.727 m)   Wt 94.3 kg   SpO2 96%   BMI 31.63 kg/m  Gen:   Awake, no distress though somewhat anxious Resp:  Normal effort  MSK:   Moves extremities without difficulty  Other:  Nontender to palpation throughout abdomen, no pulsatile masses  Medical Decision Making  Medically screening exam initiated at 6:34 AM.  Appropriate orders placed.  Sheena GORMAN Breen was informed that the remainder of the evaluation will be completed by another provider, this initial triage assessment does not replace that evaluation, and the importance of remaining in the ED until their evaluation is complete.     Clarine Ozell LABOR, MD 01/07/24 [phone]

## 2024-01-07 NOTE — Discharge Instructions (Signed)
 You are seen in the ER today for evaluation of your ongoing chest pain as well as your headache today.  Your testing today was fortunately reassuring against emergency cause for this.  Please keep your scheduled follow-up with your cardiologist.  Follow-up with your primary care doctor for further evaluation of your headaches and weakness.  Return to the ER for any new or worsening symptoms.

## 2024-01-07 NOTE — ED Provider Notes (Signed)
 [facility] Provider Note    Event Date/Time   First MD Initiated Contact with Patient 01/07/24 0701     (approximate)   History   Chest Pain   HPI  Fernando Goodwin is a 58 year old male presenting to the ER for evaluation of chest pain.  History of Reiter's syndrome, CVA/TIA, HTN.  Symptoms ongoing for the last week and a half.  This morning woke up around 3 AM with chest pain.  This is similar to events that he has had over the past week and a half.  However he also reports that he had associated left-sided headache with vomiting.  Reports occasional headaches but this was different in character, but not sudden in onset.  No associated numbness, tingling, focal weakness.  Additionally reports chills and feeling nauseous.  Reviewed primary care visit from 11/13.  Noted to have some atypical chest pain with some chest wall pain noted.  Placed on Augmentin and had referral placed to cardiology.  Has appointment at 4 PM today.  Also note that patient was seen on 11/11 for chest pain.  Had normal EKG, stress test and troponin levels ordered.  CBC and CMP were reassuring at that time.  I am unable to see the troponin result.      Physical Exam   Triage Vital Signs: ED Triage Vitals  Encounter Vitals Group     BP 01/07/24 0627 (!) 162/96     Girls Systolic BP Percentile --      Girls Diastolic BP Percentile --      Boys Systolic BP Percentile --      Boys Diastolic BP Percentile --      Pulse Rate 01/07/24 0627 75     Resp 01/07/24 0627 20     Temp 01/07/24 0627 98.3 F (36.8 C)     Temp Source 01/07/24 0627 Oral     SpO2 01/07/24 0627 96 %     Weight 01/07/24 0624 208 lb (94.3 kg)     Height 01/07/24 0624 5' 8 (1.727 m)     Head Circumference --      Peak Flow --      Pain Score 01/07/24 0624 6     Pain Loc --      Pain Education --      Exclude from Growth Chart --     Most recent vital signs: Vitals:   01/07/24 0627 01/07/24 1001  BP: (!)  162/96 106/75  Pulse: 75 65  Resp: 20 12  Temp: 98.3 F (36.8 C)   SpO2: 96% 93%     General: Awake, interactive  CV:  Good peripheral perfusion  Chest wall: Nontender to palpation Resp:  Unlabored respirations, lungs clear to auscultation Abd:  Nondistended, soft, nontender Neuro:  Symmetric facial movement, fluid speech, 5 out of 5 strength of bilateral upper and lower extremities with intact sensation, normal facial symmetry   ED Results / Procedures / Treatments   Labs (all labs ordered are listed, but only abnormal results are displayed) Labs Reviewed  CBC - Abnormal; Notable for the following components:      Result Value   RBC 5.96 (*)    Hemoglobin 17.9 (*)    All other components within normal limits  COMPREHENSIVE METABOLIC PANEL WITH GFR - Abnormal; Notable for the following components:   Potassium 6.1 (*)    Glucose, Bld 109 (*)    AST 52 (*)    All other components within normal  limits  BASIC METABOLIC PANEL WITH GFR - Abnormal; Notable for the following components:   Calcium  8.4 (*)    All other components within normal limits  RESP PANEL BY RT-PCR (RSV, FLU A&B, COVID)  RVPGX2  LIPASE, BLOOD  TROPONIN T, HIGH SENSITIVITY  TROPONIN T, HIGH SENSITIVITY     EKG EKG independently reviewed and interpreted by myself demonstrates:  EKG demonstrates sinus rhythm at a rate of 74, PR 144, QRS 91, QTc 428, no acute ST changes  RADIOLOGY Imaging independently reviewed and interpreted by myself demonstrates:  CT head without acute bleed CXR with focal consolidation  Formal Radiology Read:  CT Head Wo Contrast Result Date: 01/07/2024 EXAM: CT HEAD WITHOUT CONTRAST 01/07/2024 07:33:24 AM TECHNIQUE: CT of the head was performed without the administration of intravenous contrast. Automated exposure control, iterative reconstruction, and/or weight based adjustment of the mA/kV was utilized to reduce the radiation dose to as low as reasonably achievable. COMPARISON:  MRI brain 07/01/2023. CLINICAL HISTORY: Headache, new onset (Age >= 51y). FINDINGS: BRAIN AND VENTRICLES: No acute hemorrhage. No evidence of acute infarct. No hydrocephalus. No extra-axial collection. No mass effect or midline shift. ORBITS: No acute abnormality. SINUSES: There is an s-shaped nasal septum with right-sided spurring. SOFT TISSUES AND SKULL: No acute soft tissue abnormality. No skull fracture. IMPRESSION: 1. No acute intracranial CT findings. Stable exam. 2. S-shaped nasal septum with right-sided spurring. Electronically signed by: Francis Quam MD 01/07/2024 07:46 AM EST RP Workstation: [id number]   DG Chest Portable 1 View Result Date: 01/07/2024 EXAM: 1 VIEW(S) XRAY OF THE CHEST 01/07/2024 07:26:29 AM COMPARISON: 01/27/2021 CLINICAL HISTORY: chest pain FINDINGS: LUNGS AND PLEURA: No focal pulmonary opacity. No pleural effusion. No pneumothorax. HEART AND MEDIASTINUM: No acute abnormality of the cardiac and mediastinal silhouettes. BONES AND SOFT TISSUES: No acute osseous abnormality. IMPRESSION: 1. No acute cardiopulmonary process. Electronically signed by: Waddell Calk MD 01/07/2024 07:29 AM EST RP Workstation: [id number]    PROCEDURES:  Critical Care performed: No  Procedures   MEDICATIONS ORDERED IN ED: Medications  diphenhydrAMINE (BENADRYL) injection 25 mg (25 mg Intravenous Given 01/07/24 0742)  prochlorperazine (COMPAZINE) injection 10 mg (10 mg Intravenous Given 01/07/24 0744)  acetaminophen  (TYLENOL ) tablet 650 mg (650 mg Oral Given 01/07/24 0741)  sodium chloride  0.9 % bolus 1,000 mL (0 mLs Intravenous Stopped 01/07/24 0952)     IMPRESSION / MDM / ASSESSMENT AND PLAN / ED COURSE  I reviewed the triage vital signs and the nursing notes.  Differential diagnosis includes, but is not limited to, ACS, pneumonia, pneumothorax, very low suspicion PE or aortic dissection given reassuring vital signs, intermittent discrete episodes of chest pain, overall reassuring  neurologic exam without focal deficits, but consideration for [facility] or other intracranial bleed  Patient's presentation is most consistent with acute presentation with potential threat to life or bodily function.  58 year old male presenting to the emergency department for evaluation of chest pain ongoing for a week and a half, now with headache, chills, nausea.  Will obtain labs, x-Ettie Krontz, head CT to further evaluate.  Onset of headache within the last 6 hours.  Clinical Course as of 01/07/24 1042  Mon Jan 07, 2024  9051 Initial troponin negative.  With onset of symptoms shortly prior to presentation, will obtain repeat.  CBC with elevated hemoglobin, possible component of hemoconcentration.  CMP with elevated potassium at 6.1 but in the setting of hemolysis, suspect likely due to this, but will obtain repeat. Patient reassessed, feels much improved, resolved headache.  If  repeat troponin reassuring and BMP with resolution of hyperkalemia, suspect patient will be stable for discharge with outpatient follow-up with cardiology later today. [NR]  1040 Repeat troponin returned within normal limits.  Repeat BMP with normalized potassium. Patient reassessed.  Resolution of chest pain and headache.  No new complaints.  Comfortable with discharge home which I do think is reasonable given reassuring workup and scheduled follow-up with cardiology later today.  Strict return precautions provided.  Patient discharged in stable condition. [NR]    Clinical Course User Index [NR] Levander Slate, MD     FINAL CLINICAL IMPRESSION(S) / ED DIAGNOSES   Final diagnoses:  Nonspecific chest pain  Acute nonintractable headache, unspecified headache type     Rx / DC Orders   ED Discharge Orders     None        Note:  This document was prepared using Dragon voice recognition software and may include unintentional dictation errors.   Levander Slate, MD 01/07/24 [phone]

## 2024-01-07 NOTE — ED Triage Notes (Addendum)
 To ER from home with complaint of waking around 3a with report of chest pain. Took 1 nitroglycerin  around 545a with no relief. Has not taken morning medications yet today.  Appointment today with cardiology. Symptoms have been going on for over a week.

## 2024-01-15 ENCOUNTER — Other Ambulatory Visit: Payer: Self-pay | Admitting: Physician Assistant

## 2024-01-15 DIAGNOSIS — E782 Mixed hyperlipidemia: Secondary | ICD-10-CM

## 2024-01-15 DIAGNOSIS — I10 Essential (primary) hypertension: Secondary | ICD-10-CM

## 2024-01-15 DIAGNOSIS — R079 Chest pain, unspecified: Secondary | ICD-10-CM

## 2024-01-16 ENCOUNTER — Ambulatory Visit
Admission: RE | Admit: 2024-01-16 | Discharge: 2024-01-16 | Disposition: A | Discharge status: Home / Self Care | Source: Ambulatory Visit | Attending: Physician Assistant | Admitting: Physician Assistant

## 2024-01-16 DIAGNOSIS — I10 Essential (primary) hypertension: Secondary | ICD-10-CM | POA: Insufficient documentation

## 2024-01-16 DIAGNOSIS — R079 Chest pain, unspecified: Secondary | ICD-10-CM | POA: Insufficient documentation

## 2024-01-16 DIAGNOSIS — E782 Mixed hyperlipidemia: Secondary | ICD-10-CM | POA: Insufficient documentation

## 2024-01-16 MED ORDER — REGADENOSON 0.4 MG/5ML IV SOLN
0.4000 mg | Freq: Once | INTRAVENOUS | Status: AC
  Administered 2024-01-16: 0.4 mg via INTRAVENOUS

## 2024-01-16 MED ORDER — TECHNETIUM TC 99M TETROFOSMIN IV KIT
29.1900 | PACK | Freq: Once | INTRAVENOUS | Status: AC | PRN
  Administered 2024-01-16: 29.19 via INTRAVENOUS

## 2024-01-16 MED ORDER — TECHNETIUM TC 99M TETROFOSMIN IV KIT
10.0000 | PACK | Freq: Once | INTRAVENOUS | Status: AC | PRN
  Administered 2024-01-16: 10.5 via INTRAVENOUS

## 2024-01-21 LAB — NM MYOCAR MULTI W/SPECT W/WALL MOTION / EF
Base ST Depression (mm): 0 mm
Estimated workload: 1
Exercise duration (min): 1 min
Exercise duration (sec): 0 s
LV dias vol: 87 mL (ref 62–150)
LV sys vol: 25 mL (ref 4.2–5.8)
MPHR: 160 {beats}/min
Nuc Stress EF: 71 %
Peak HR: 103 {beats}/min
Percent HR: 64 %
Rest HR: 60 {beats}/min
Rest Nuclear Isotope Dose: 10.5 mCi
SDS: 0
SRS: 17
SSS: 9
ST Depression (mm): 0 mm
Stress Nuclear Isotope Dose: 29.2 mCi
TID: 1.02

## 2024-01-21 NOTE — Progress Notes (Signed)
 Established Patient Visit   Chief Complaint: Chief Complaint  Patient presents with  . Hypertension  . Chest Pain    FU Myoview    Date of Service: 01/21/2024 Date of Birth: 1965-06-16 PCP: Sadie Tamra Cal, MD  History of Present Illness: Mr. Kincaid is a 58 y.o.male patient with a history of hypertension, Reiter's syndrome, hyperlipidemia, OSA, noncompliant with CPAP, stroke in 2015 and recurrent TIAs with marked hypertension during these.   He recently presented as a new patient for evaluation of recurrent chest pain. He reported a 1.5 week history of chest pain, localized to the left side of his chest, radiating down his arm, and is accompanied by lightheadedness, nausea, vomiting, and weakness. Each episode lasted approximately 30 minutes. He initially managed the symptoms with low-dose aspirin , but the episodes persisted, prompting him to seek medical attention. He was evaluated by primary care on 01/01/2024. Stress test was ordered, but patient canceled because it could not be completed for about 3 weeks. Troponins were normal x 2. EKG stable. He followed up with Myer Manus, PA on 01/03/2024 for recurrent chest pain. It was noted that he had chest pain on palpation. Chest xray ordered, and he was started on Augmentin for productive cough. He then presented to [facility] ER on 01/07/2024 (this morning) because he woke up at 3 AM with headache, chest pain, and vomiting. Troponin x 2 were normal. Head CT negative for acute abnormalities. Chest xray negative for acute process. Headache and chest pain resolved, and patient was discharged.   History of Present Illness He presents today for a follow-up, and to review results of Myoview . He experiences chest pain that is more constant than intermittent, with occasional episodes of increased severity. The pain radiates from the center of his chest towards his left shoulder. He feels nauseous, sweaty, and experiences temperature fluctuations, feeling hot  and then cold during these episodes. The pain eases slightly at night but is generally persistent. He underwent Lexiscan  Myoview  on 01/16/2024. Gated scintigraphy revealed LVEF 71%. SPECT imaging revealed mild inferior scar with no ischemia.   He has a history of Paxil  use, which he discontinued due to weight gain after gradually tapering off over several years. He has been off Paxil  for four to five months but recently resumed taking 30 mg tablets two days ago. He associates nausea with restarting Paxil . He reports significant depressive symptoms, including a lack of interest in activities, excessive sleep, and poor appetite. He has not been able to re-establish care with his previous psychiatrist, who has retired, and has not been able to see his current psychiatrist due to scheduling issues.  He is currently taking CoQ10, low-dose aspirin , and losartan   12.5 mg for blood pressure. He previously experienced arm weakness with statins and is not currently on cholesterol-lowering medication.     Past Medical and Surgical History  Past Medical History Past Medical History:  Diagnosis Date  . Allergic state   . Anxiety   . Asthma without status asthmaticus (HHS-HCC)   . Bruxism   . Chest pain with high risk for cardiac etiology   . Diverticulosis   . Elevated TSH   . GERD (gastroesophageal reflux disease)   . History of panic disorder 10/13/2013  . History of transient ischemic attack (TIA)   . Hyperlipidemia, mixed   . Hypertension   . Injury of tendon of long head of right biceps   . Mild reactive airways disease (HHS-HCC)   . Nontraumatic incomplete tear of right rotator cuff   .  OSA (obstructive sleep apnea)   . Panic disorder   . Reactive arthritis (CMS/HHS-HCC)    balanitis, oral lesion, synovitis right hand, bilateral knees, left foot, positive HLA-B27, s/p sulfasalazine, Enbrel, methotrexate, psoriasiform rash  . Rotator cuff tendinitis, right   . Stroke (CMS/HHS-HCC)   .  Superior labrum anterior-to-posterior (SLAP) tear of right shoulder     Past Surgical History He has a past surgical history that includes Hernia repair (Bilateral, 02/20/2010); Extensive arthroscopic debridement, arthroscopic subacromial decompression, and mini-open biceps tenodesis, right shoulder. (Right, 01/25/2021); and FRACTURE SURGERY.   Medications and Allergies  Current Medications  Current Outpatient Medications  Medication Sig Dispense Refill  . aspirin  81 MG EC tablet Take 81 mg by mouth once daily    . losartan -hydroCHLOROthiazide  (HYZAAR) 100-12.5 mg tablet Take 1 tablet by mouth once daily 30 tablet 2  . nitroGLYcerin  (NITROSTAT ) 0.4 MG SL tablet Place 1 tablet (0.4 mg total) under the tongue every 5 (five) minutes as needed for Chest pain May take up to 3 doses. 25 tablet 2  . PARoxetine  (PAXIL ) 30 MG tablet Take 15 mg by mouth once daily    . cetirizine (ZYRTEC) 10 mg capsule Take 10 mg by mouth once daily. (Patient not taking: Reported on 01/21/2024)    . clopidogreL  (PLAVIX ) 75 mg tablet TAKE 1 TABLET BY MOUTH DAILY (Patient not taking: Reported on 01/21/2024) 90 tablet 1  . metoprolol  TARTrate (LOPRESSOR ) 25 MG tablet Take 1 dose (25 mg total) by mouth at bedtime the night before your exam and take 1 dose (25 mg total) by mouth the morning of your heart CT exam. 2 tablet 0  . ubidecarenone (COENZYME Q10) 100 mg Tab Take by mouth (Patient not taking: Reported on 01/21/2024)     No current facility-administered medications for this visit.    Allergies: Oxycodone   Social and Family History  Social History  reports that he quit smoking about 33 years ago. His smoking use included cigarettes. He has never used smokeless tobacco. He reports that he does not currently use alcohol. He reports that he does not use drugs.  Family History Family History  Problem Relation Name Age of Onset  . Breast cancer Mother    . Bilateral Breast Cancer Mother    . Dementia Father    .  Fainting (Syncope-sudden loss of consciousness) Father    . High blood pressure (Hypertension) Father    . No Known Problems Brother Cathlyn   . No Known Problems Daughter Raguel   . No Known Problems Son Norman   . No Known Problems Maternal Aunt    . No Known Problems Maternal Uncle    . No Known Problems Paternal Aunt    . No Known Problems Paternal Uncle      Review of Systems   Review of Systems: The patient reports chest pain, with shortness of breath, orthopnea, paroxysmal nocturnal dyspnea, pedal edema, palpitations, heart racing, presyncope, syncope, with depression, with fatigue, with poor appetite. Review of 10 Systems is negative except as described above.  Physical Examination   Vitals:BP 130/80   Pulse 79   Ht 170.2 cm (5' 7)   Wt 90.3 kg (199 lb)   SpO2 95%   BMI 31.17 kg/m  Ht:170.2 cm (5' 7) Wt:90.3 kg (199 lb) ADJ:Anib surface area is 2.07 meters squared. Body mass index is 31.17 kg/m.  General: Alert and oriented. Tearful.  No acute distress. HEENT: Pupils equally reactive to light and accomodation    Neck:  no JVD Lungs: Normal effort of breathing; clear to auscultation bilaterally; no wheezes, rales, rhonchi Heart: Regular rate and rhythm. No murmur, rub, or gallop Abdomen:  nondistended Extremities: no cyanosis, clubbing, or edema Peripheral Pulses: 2+ radial  Skin: Warm, dry, no diaphoresis   Assessment   58 y.o. male with  1. Abnormal cardiovascular stress test   2. Need for vaccination   3. Hyperlipidemia, mixed   4. History of transient ischemic attack (TIA)   5. Hypertension, essential    58 year old gentleman history of  hypertension, Reiter's syndrome, hyperlipidemia, OSA, noncompliant with CPAP, stroke in 2015 and recurrent TIAs, with recurrent chest pain. He has been evaluated by primary care x2 and the ED with negative troponins x 5. Lexiscan  Myoview  showed normal LVEF with inferior scar without ischemia. Patient has no clinical history of  MI. He continues to experience atypical chest pain. He also is acutely depressed, and would benefit from prompt evaluation by internal medicine.     Plan   Continue current medications Appointment made for patient to see Myer Manus, PA for management of depression Cardiac CTA for abnormal stress test and chest pain Return to clinic after CCTA to discuss results.  Orders Placed This Encounter  Procedures  . CT heart angiogram    Return for after cardiac CTA.  Attestation Statement:   I personally performed the service, non-incident to. (WP)   ANNA MARIA DRANE, PA-C

## 2024-01-22 ENCOUNTER — Other Ambulatory Visit: Payer: Self-pay | Admitting: Physician Assistant

## 2024-01-22 DIAGNOSIS — R9439 Abnormal result of other cardiovascular function study: Secondary | ICD-10-CM

## 2024-01-22 DIAGNOSIS — I10 Essential (primary) hypertension: Secondary | ICD-10-CM

## 2024-01-22 DIAGNOSIS — E782 Mixed hyperlipidemia: Secondary | ICD-10-CM

## 2024-01-23 ENCOUNTER — Telehealth (HOSPITAL_COMMUNITY): Payer: Self-pay | Admitting: *Deleted

## 2024-01-23 NOTE — Telephone Encounter (Signed)
 Reaching out to patient to offer assistance regarding upcoming cardiac imaging study; pt verbalizes understanding of appt date/time, parking situation and where to check in, pre-test NPO status and medications ordered, and verified current allergies; name and call back number provided for further questions should they arise  Chantal Requena RN Navigator Cardiac Imaging [facility] [phone] office [phone] cell  Patient to take 25mg  metoprolol  tartrate two hours prior to his cardiac CT scan. He reports he is a difficult IV stick.

## 2024-01-24 ENCOUNTER — Ambulatory Visit: Admission: RE | Admit: 2024-01-24 | Discharge: 2024-01-24 | Attending: Physician Assistant

## 2024-01-24 DIAGNOSIS — I10 Essential (primary) hypertension: Secondary | ICD-10-CM | POA: Diagnosis present

## 2024-01-24 DIAGNOSIS — R9439 Abnormal result of other cardiovascular function study: Secondary | ICD-10-CM | POA: Insufficient documentation

## 2024-01-24 DIAGNOSIS — E782 Mixed hyperlipidemia: Secondary | ICD-10-CM | POA: Diagnosis present

## 2024-01-24 MED ORDER — DILTIAZEM HCL 25 MG/5ML IV SOLN
10.0000 mg | INTRAVENOUS | Status: DC | PRN
  Filled 2024-01-24: qty 5

## 2024-01-24 MED ORDER — NITROGLYCERIN 0.4 MG SL SUBL
0.8000 mg | SUBLINGUAL_TABLET | Freq: Once | SUBLINGUAL | Status: AC
  Administered 2024-01-24: 0.8 mg via SUBLINGUAL
  Filled 2024-01-24: qty 25

## 2024-01-24 MED ORDER — IOHEXOL 350 MG/ML SOLN
100.0000 mL | Freq: Once | INTRAVENOUS | Status: AC | PRN
  Administered 2024-01-24: 100 mL via INTRAVENOUS

## 2024-01-24 MED ORDER — METOPROLOL TARTRATE 5 MG/5ML IV SOLN
10.0000 mg | Freq: Once | INTRAVENOUS | Status: DC | PRN
  Filled 2024-01-24: qty 10

## 2024-01-24 NOTE — Progress Notes (Signed)
 Patient tolerated CT well. Drank water after. Vital signs stable encourage to drink water throughout day.Reasons explained and verbalized understanding. Ambulated steady gait.
